# Patient Record
Sex: Female | Born: 1990 | Race: Black or African American | Hispanic: No | Marital: Single | Smoking: Current some day smoker
Health system: Southern US, Community
[De-identification: ages and names within clinical notes are randomized; demographics above are authoritative.]

## PROBLEM LIST (undated history)

## (undated) DIAGNOSIS — Z3490 Encounter for supervision of normal pregnancy, unspecified, unspecified trimester: Secondary | ICD-10-CM

---

## 2012-12-08 DIAGNOSIS — N912 Amenorrhea, unspecified: Secondary | ICD-10-CM | POA: Insufficient documentation

## 2012-12-08 DIAGNOSIS — Z3201 Encounter for pregnancy test, result positive: Secondary | ICD-10-CM | POA: Insufficient documentation

## 2013-06-25 ENCOUNTER — Emergency Department (HOSPITAL_BASED_OUTPATIENT_CLINIC_OR_DEPARTMENT_OTHER)
Admission: EM | Admit: 2013-06-25 | Discharge: 2013-06-25 | Disposition: A | Discharge status: Home / Self Care | Payer: Medicaid Other | Attending: Emergency Medicine | Admitting: Emergency Medicine

## 2013-06-25 ENCOUNTER — Encounter (HOSPITAL_BASED_OUTPATIENT_CLINIC_OR_DEPARTMENT_OTHER): Payer: Self-pay | Admitting: Emergency Medicine

## 2013-06-25 DIAGNOSIS — Z79899 Other long term (current) drug therapy: Secondary | ICD-10-CM | POA: Insufficient documentation

## 2013-06-25 DIAGNOSIS — A599 Trichomoniasis, unspecified: Secondary | ICD-10-CM

## 2013-06-25 DIAGNOSIS — O98819 Other maternal infectious and parasitic diseases complicating pregnancy, unspecified trimester: Secondary | ICD-10-CM | POA: Insufficient documentation

## 2013-06-25 DIAGNOSIS — A5901 Trichomonal vulvovaginitis: Secondary | ICD-10-CM | POA: Insufficient documentation

## 2013-06-25 LAB — URINALYSIS, ROUTINE W REFLEX MICROSCOPIC
BILIRUBIN URINE: NEGATIVE
Glucose, UA: NEGATIVE mg/dL
KETONES UR: NEGATIVE mg/dL
NITRITE: NEGATIVE
Protein, ur: NEGATIVE mg/dL
Specific Gravity, Urine: 1.023 (ref 1.005–1.030)
UROBILINOGEN UA: 1 mg/dL (ref 0.0–1.0)
pH: 6 (ref 5.0–8.0)

## 2013-06-25 LAB — WET PREP, GENITAL: YEAST WET PREP: NONE SEEN

## 2013-06-25 LAB — URINE MICROSCOPIC-ADD ON

## 2013-06-25 MED ORDER — ONDANSETRON HCL 8 MG PO TABS
4.0000 mg | ORAL_TABLET | Freq: Once | ORAL | Status: AC
  Administered 2013-06-25: 18:00:00 via ORAL
  Filled 2013-06-25: qty 1

## 2013-06-25 MED ORDER — METRONIDAZOLE 500 MG PO TABS
2000.0000 mg | ORAL_TABLET | Freq: Once | ORAL | Status: AC
  Administered 2013-06-25: 2000 mg via ORAL
  Filled 2013-06-25: qty 4

## 2013-06-25 NOTE — Discharge Instructions (Signed)
Trichomoniasis °Trichomoniasis is an infection, caused by the Trichomonas organism, that affects both women and men. In women, the outer female genitalia and the vagina are affected. In men, the penis is mainly affected, but the prostate and other reproductive organs can also be involved. Trichomoniasis is a sexually transmitted disease (STD) and is most often passed to another person through sexual contact. The majority of people who get trichomoniasis do so from a sexual encounter and are also at risk for other STDs. °CAUSES  °· Sexual intercourse with an infected partner. °· It can be present in swimming pools or hot tubs. °SYMPTOMS  °· Abnormal gray-green frothy vaginal discharge in women. °· Vaginal itching and irritation in women. °· Itching and irritation of the area outside the vagina in women. °· Penile discharge with or without pain in males. °· Inflammation of the urethra (urethritis), causing painful urination. °· Bleeding after sexual intercourse. °RELATED COMPLICATIONS °· Pelvic inflammatory disease. °· Infection of the uterus (endometritis). °· Infertility. °· Tubal (ectopic) pregnancy. °· It can be associated with other STDs, including gonorrhea and chlamydia, hepatitis B, and HIV. °COMPLICATIONS DURING PREGNANCY °· Early (premature) delivery. °· Premature rupture of the membranes (PROM). °· Low birth weight. °DIAGNOSIS  °· Visualization of Trichomonas under the microscope from the vagina discharge. °· Ph of the vagina greater than 4.5, tested with a test tape. °· Trich Rapid Test. °· Culture of the organism, but this is not usually needed. °· It may be found on a Pap test. °· Having a "strawberry cervix,"which means the cervix looks very red like a strawberry. °TREATMENT  °· You may be given medication to fight the infection. Inform your caregiver if you could be or are pregnant. Some medications used to treat the infection should not be taken during pregnancy. °· Over-the-counter medications or  creams to decrease itching or irritation may be recommended. °· Your sexual partner will need to be treated if infected. °HOME CARE INSTRUCTIONS  °· Take all medication prescribed by your caregiver. °· Take over-the-counter medication for itching or irritation as directed by your caregiver. °· Do not have sexual intercourse while you have the infection. °· Do not douche or wear tampons. °· Discuss your infection with your partner, as your partner may have acquired the infection from you. Or, your partner may have been the person who transmitted the infection to you. °· Have your sex partner examined and treated if necessary. °· Practice safe, informed, and protected sex. °· See your caregiver for other STD testing. °SEEK MEDICAL CARE IF:  °· You still have symptoms after you finish the medication. °· You have an oral temperature above 102° F (38.9° C). °· You develop belly (abdominal) pain. °· You have pain when you urinate. °· You have bleeding after sexual intercourse. °· You develop a rash. °· The medication makes you sick or makes you throw up (vomit). °Document Released: 09/25/2000 Document Revised: 06/24/2011 Document Reviewed: 10/21/2008 °[employer]® Patient Information ©2014 [employer]. ° °

## 2013-06-25 NOTE — ED Notes (Signed)
Pt. Reports she last saw OBGYN in Jan. 2015  Dr. Delford FieldWright with [facility].  Pt. Reports she is to see OBGYN again in April 2015

## 2013-06-25 NOTE — ED Notes (Signed)
Vaginal discharge thick and white. Lower abdominal pain. She is [redacted] weeks pregnant. Dysuria.

## 2013-06-25 NOTE — ED Provider Notes (Signed)
CSN: [id number]     Arrival date & time 06/25/13  1507 History   First MD Initiated Contact with Patient 06/25/13 1544     Chief Complaint  Patient presents with  . Abdominal Pain     (Consider location/radiation/quality/duration/timing/severity/associated sxs/prior Treatment) Patient is a 23 y.o. female presenting with abdominal pain.  Abdominal Pain  Pt is G6P1A4 at approx [redacted]wks gestation from previously done US reports she has had intermittent lower abdomen/pelvic pain throughout pregnancy but also began to have thick white vaginal discharge 2 days ago. She has not seen Ob yet due to weather related closures at the office but is scheduled for visit in about 3 weeks. She denies any vaginal bleeding, no fever. Mild diffuse back pain. No dysuria  History reviewed. No pertinent past medical history. History reviewed. No pertinent past surgical history. No family history on file. History  Substance Use Topics  . Smoking status: Never Smoker   . Smokeless tobacco: Not on file  . Alcohol Use: No   OB History   Grav Para Term Preterm Abortions TAB SAB Ect Mult Living   1              Review of Systems  Gastrointestinal: Positive for abdominal pain.   All other systems reviewed and are negative except as noted in HPI.     Allergies  Ibuprofen  Home Medications   Current Outpatient Rx  Name  Route  Sig  Dispense  Refill  . Prenatal Multivit-Min-Fe-FA (PRENATAL VITAMINS PO)   Oral   Take by mouth.          BP 109/71  Pulse 89  Temp(Src) 98.6 F (37 C) (Oral)  Resp 18  Ht 5\' 8"  (1.727 m)  Wt 212 lb (96.163 kg)  BMI 32.24 kg/m2  SpO2 99%  LMP 03/16/2013 Physical Exam  Nursing note and vitals reviewed. Constitutional: She is oriented to person, place, and time. She appears well-developed and well-nourished.  HENT:  Head: Normocephalic and atraumatic.  Eyes: EOM are normal. Pupils are equal, round, and reactive to light.  Neck: Normal range of motion. Neck  supple.  Cardiovascular: Normal rate, normal heart sounds and intact distal pulses.   Pulmonary/Chest: Effort normal and breath sounds normal.  Abdominal: Bowel sounds are normal. She exhibits no distension. There is no tenderness. There is no rebound and no guarding.  Genitourinary: Vaginal discharge found.  No bleeding, os closed, no adnexal masses, mild diffuse pelvic tenderness to palpation, uterus consistent with dates  Musculoskeletal: Normal range of motion. She exhibits no edema and no tenderness.  Neurological: She is alert and oriented to person, place, and time. She has normal strength. No cranial nerve deficit or sensory deficit.  Skin: Skin is warm and dry. No rash noted.  Psychiatric: She has a normal mood and affect.    ED Course  Procedures (including critical care time) Labs Review Labs Reviewed  WET PREP, GENITAL - Abnormal; Notable for the following:    Trich, Wet Prep FEW (*)    Clue Cells Wet Prep HPF POC MODERATE (*)    WBC, Wet Prep HPF POC MODERATE (*)    All other components within normal limits  URINALYSIS, ROUTINE W REFLEX MICROSCOPIC - Abnormal; Notable for the following:    APPearance CLOUDY (*)    Hgb urine dipstick TRACE (*)    Leukocytes, UA MODERATE (*)    All other components within normal limits  URINE MICROSCOPIC-ADD ON - Abnormal; Notable for the following:  Squamous Epithelial / LPF FEW (*)    Bacteria, UA FEW (*)    Crystals CA OXALATE CRYSTALS (*)    All other components within normal limits  GC/CHLAMYDIA PROBE AMP   Imaging Review No results found.   EKG Interpretation None      MDM   Final diagnoses:  Trichomonas infection   UA reported pos for Trichomonas, will confirm with vaginal swab.     Kandiss Ihrig B. Bernette Mayers, MD 06/25/13 1743

## 2013-06-26 LAB — GC/CHLAMYDIA PROBE AMP
CT Probe RNA: NEGATIVE
GC Probe RNA: NEGATIVE

## 2013-07-19 DIAGNOSIS — Z331 Pregnant state, incidental: Secondary | ICD-10-CM | POA: Insufficient documentation

## 2013-08-11 DIAGNOSIS — R87613 High grade squamous intraepithelial lesion on cytologic smear of cervix (HGSIL): Secondary | ICD-10-CM | POA: Insufficient documentation

## 2013-10-26 DIAGNOSIS — O98519 Other viral diseases complicating pregnancy, unspecified trimester: Secondary | ICD-10-CM

## 2013-10-26 DIAGNOSIS — B009 Herpesviral infection, unspecified: Secondary | ICD-10-CM | POA: Insufficient documentation

## 2013-10-26 DIAGNOSIS — O4700 False labor before 37 completed weeks of gestation, unspecified trimester: Secondary | ICD-10-CM | POA: Insufficient documentation

## 2013-12-01 DIAGNOSIS — O99820 Streptococcus B carrier state complicating pregnancy: Secondary | ICD-10-CM | POA: Insufficient documentation

## 2013-12-17 DIAGNOSIS — N390 Urinary tract infection, site not specified: Secondary | ICD-10-CM | POA: Insufficient documentation

## 2014-02-14 ENCOUNTER — Encounter (HOSPITAL_BASED_OUTPATIENT_CLINIC_OR_DEPARTMENT_OTHER): Payer: Self-pay | Admitting: Emergency Medicine

## 2014-05-31 ENCOUNTER — Emergency Department (HOSPITAL_BASED_OUTPATIENT_CLINIC_OR_DEPARTMENT_OTHER): Payer: 59

## 2014-05-31 ENCOUNTER — Encounter (HOSPITAL_BASED_OUTPATIENT_CLINIC_OR_DEPARTMENT_OTHER): Payer: Self-pay | Admitting: *Deleted

## 2014-05-31 ENCOUNTER — Emergency Department (HOSPITAL_BASED_OUTPATIENT_CLINIC_OR_DEPARTMENT_OTHER)
Admission: EM | Admit: 2014-05-31 | Discharge: 2014-05-31 | Disposition: A | Discharge status: Home / Self Care | Payer: 59 | Attending: Emergency Medicine | Admitting: Emergency Medicine

## 2014-05-31 DIAGNOSIS — H938X3 Other specified disorders of ear, bilateral: Secondary | ICD-10-CM | POA: Insufficient documentation

## 2014-05-31 DIAGNOSIS — R05 Cough: Secondary | ICD-10-CM | POA: Diagnosis present

## 2014-05-31 DIAGNOSIS — J069 Acute upper respiratory infection, unspecified: Secondary | ICD-10-CM | POA: Insufficient documentation

## 2014-05-31 MED ORDER — SALINE SPRAY 0.65 % NA SOLN: 1.0000 | NASAL | Status: DC | PRN

## 2014-05-31 NOTE — ED Notes (Signed)
Cough with blood tinged sputum and stuffy nose with green returns.

## 2014-05-31 NOTE — ED Provider Notes (Signed)
CSN: [id number]     Arrival date & time 05/31/14  1121 History   First MD Initiated Contact with Patient 05/31/14 1341     Chief Complaint  Patient presents with  . Cough     (Consider location/radiation/quality/duration/timing/severity/associated sxs/prior Treatment) HPI Molly Sawyer is a 24 year old female with no past medical history who presents the ER complaining of nasal congestion, cough. Patient states her symptoms began gradually approximately 3 days ago, and have since persisted. Patient reports associated productive cough with green sputum and mild streaks of blood, ear fullness bilaterally, facial pain, mild body aches, mild sore throat. Patient denies shortness of breath, chest pain, dizziness, nausea, vomiting, fever.  History reviewed. No pertinent past medical history. Past Surgical History  Procedure Laterality Date  . Cesarean section     No family history on file. History  Substance Use Topics  . Smoking status: Never Smoker   . Smokeless tobacco: Not on file  . Alcohol Use: No   OB History    Gravida Para Term Preterm AB TAB SAB Ectopic Multiple Living   1              Review of Systems  Constitutional: Negative for fever.  HENT: Positive for congestion, sinus pressure and sore throat. Negative for hearing loss, trouble swallowing and voice change.   Eyes: Negative for visual disturbance.  Respiratory: Positive for cough. Negative for chest tightness and shortness of breath.   Cardiovascular: Negative for chest pain.  Gastrointestinal: Negative for nausea, vomiting and abdominal pain.  Genitourinary: Negative for dysuria.  Musculoskeletal: Positive for myalgias.  Skin: Negative for rash.  Neurological: Negative for dizziness, syncope, weakness, numbness and headaches.  Psychiatric/Behavioral: Negative.       Allergies  Ibuprofen  Home Medications   Prior to Admission medications   Medication Sig Start Date End Date Taking? Authorizing Provider   Prenatal Multivit-Min-Fe-FA (PRENATAL VITAMINS PO) Take by mouth.    Historical Provider, MD  sodium chloride (OCEAN) 0.65 % SOLN nasal spray Place 1 spray into both nostrils as needed for congestion. 05/31/14   Monte FantasiaJoseph W Adaley Kiene, PA-C   BP 122/77 mmHg  Pulse 68  Temp(Src) 98 F (36.7 C) (Oral)  Resp 16  Ht 5\' 8"  (1.727 m)  Wt 216 lb (97.977 kg)  BMI 32.85 kg/m2  SpO2 98%  LMP 05/08/2013  Breastfeeding? Unknown Physical Exam  Constitutional: She is oriented to person, place, and time. She appears well-developed and well-nourished. No distress.  HENT:  Head: Normocephalic and atraumatic.  Right Ear: Tympanic membrane normal.  Left Ear: Tympanic membrane normal.  Nose: Right sinus exhibits maxillary sinus tenderness and frontal sinus tenderness. Left sinus exhibits maxillary sinus tenderness and frontal sinus tenderness.  Mouth/Throat: Uvula is midline and mucous membranes are normal. No trismus in the jaw. No dental abscesses or uvula swelling. Posterior oropharyngeal erythema present. No oropharyngeal exudate, posterior oropharyngeal edema or tonsillar abscesses.  TMs well-appearing with mild serous effusion. Nasal turbinates mildly enlarged bilaterally with mild erythema. Maxillary and frontal sinus tenderness noted bilaterally. Mild posterior oropharyngeal erythema without edema. No tonsillar exudate or swelling. Uvula midline no trismus.  Eyes: Right eye exhibits no discharge. Left eye exhibits no discharge. No scleral icterus.  Neck: Normal range of motion.  Pulmonary/Chest: Effort normal and breath sounds normal. No accessory muscle usage. No tachypnea. No respiratory distress.  Musculoskeletal: Normal range of motion.  Neurological: She is alert and oriented to person, place, and time. She has normal strength. No cranial nerve deficit  or sensory deficit. Gait normal. GCS eye subscore is 4. GCS verbal subscore is 5. GCS motor subscore is 6.  Skin: Skin is warm and dry. She is not  diaphoretic.  Psychiatric: She has a normal mood and affect.  Nursing note and vitals reviewed.   ED Course  Procedures (including critical care time) Labs Review Labs Reviewed - No data to display  Imaging Review Dg Chest 2 View  05/31/2014   CLINICAL DATA:  24 year old female with 1 week history of cough and congestion. Blood tinged sputum.  EXAM: CHEST  2 VIEW  COMPARISON:  No priors.  FINDINGS: Lung volumes are normal. No consolidative airspace disease. No pleural effusions. No pneumothorax. No pulmonary nodule or mass noted. Pulmonary vasculature and the cardiomediastinal silhouette are within normal limits.  IMPRESSION: No radiographic evidence of acute cardiopulmonary disease.   Electronically Signed   By: Trudie Reed M.D.   On: 05/31/2014 12:05     EKG Interpretation None      MDM   Final diagnoses:  URI (upper respiratory infection)    Pt CXR negative for acute infiltrate. Patients symptoms are consistent with URI, likely viral etiology. No concern for PTA or retropharyngeal abscess. No concern for pneumonia. Discussed that antibiotics are not indicated for viral infections. Pt will be discharged with symptomatic treatment.  Verbalizes understanding and is agreeable with plan. Pt is hemodynamically stable & in NAD prior to dc. I discussed return precautions with patient, patient verbalizes understanding and agreement of this plan. I encouraged patient to follow up with a primary care doctor and provided her with a resource guide to help her find one. I encouraged patient to call or return to the ER if any worsening of symptoms or should she have any questions or concerns.  BP 122/77 mmHg  Pulse 68  Temp(Src) 98 F (36.7 C) (Oral)  Resp 16  Ht  (1.727 m)  Wt 216 lb (97.977 kg)  BMI 32.85 kg/m2  SpO2 98%  LMP 05/08/2013  Breastfeeding? Unknown  Signed,  Ladona Mow, PA-C 2:24 PM      Monte Fantasia, PA-C 05/31/14 1424  Derwood Kaplan, MD 05/31/14  1540

## 2014-05-31 NOTE — Discharge Instructions (Signed)
Upper Respiratory Infection, Adult °An upper respiratory infection (URI) is also sometimes known as the common cold. The upper respiratory tract includes the nose, sinuses, throat, trachea, and bronchi. Bronchi are the airways leading to the lungs. Most people improve within 1 week, but symptoms can last up to 2 weeks. A residual cough may last even longer.  °CAUSES °Many different viruses can infect the tissues lining the upper respiratory tract. The tissues become irritated and inflamed and often become very moist. Mucus production is also common. A cold is contagious. You can easily spread the virus to others by oral contact. This includes kissing, sharing a glass, coughing, or sneezing. Touching your mouth or nose and then touching a surface, which is then touched by another person, can also spread the virus. °SYMPTOMS  °Symptoms typically develop 1 to 3 days after you come in contact with a cold virus. Symptoms vary from person to person. They may include: °· Runny nose. °· Sneezing. °· Nasal congestion. °· Sinus irritation. °· Sore throat. °· Loss of voice (laryngitis). °· Cough. °· Fatigue. °· Muscle aches. °· Loss of appetite. °· Headache. °· Low-grade fever. °DIAGNOSIS  °You might diagnose your own cold based on familiar symptoms, since most people get a cold 2 to 3 times a year. Your caregiver can confirm this based on your exam. Most importantly, your caregiver can check that your symptoms are not due to another disease such as strep throat, sinusitis, pneumonia, asthma, or epiglottitis. Blood tests, throat tests, and X-rays are not necessary to diagnose a common cold, but they may sometimes be helpful in excluding other more serious diseases. Your caregiver will decide if any further tests are required. °RISKS AND COMPLICATIONS  °You may be at risk for a more severe case of the common cold if you smoke cigarettes, have chronic heart disease (such as heart failure) or lung disease (such as asthma), or if  you have a weakened immune system. The very young and very old are also at risk for more serious infections. Bacterial sinusitis, middle ear infections, and bacterial pneumonia can complicate the common cold. The common cold can worsen asthma and chronic obstructive pulmonary disease (COPD). Sometimes, these complications can require emergency medical care and may be life-threatening. °PREVENTION  °The best way to protect against getting a cold is to practice good hygiene. Avoid oral or hand contact with people with cold symptoms. Wash your hands often if contact occurs. There is no clear evidence that vitamin C, vitamin E, echinacea, or exercise reduces the chance of developing a cold. However, it is always recommended to get plenty of rest and practice good nutrition. °TREATMENT  °Treatment is directed at relieving symptoms. There is no cure. Antibiotics are not effective, because the infection is caused by a virus, not by bacteria. Treatment may include: °· Increased fluid intake. Sports drinks offer valuable electrolytes, sugars, and fluids. °· Breathing heated mist or steam (vaporizer or shower). °· Eating chicken soup or other clear broths, and maintaining good nutrition. °· Getting plenty of rest. °· Using gargles or lozenges for comfort. °· Controlling fevers with ibuprofen or acetaminophen as directed by your caregiver. °· Increasing usage of your inhaler if you have asthma. °Zinc gel and zinc lozenges, taken in the first 24 hours of the common cold, can shorten the duration and lessen the severity of symptoms. Pain medicines may help with fever, muscle aches, and throat pain. A variety of non-prescription medicines are available to treat congestion and runny nose. Your caregiver   can make recommendations and may suggest nasal or lung inhalers for other symptoms.  °HOME CARE INSTRUCTIONS  °· Only take over-the-counter or prescription medicines for pain, discomfort, or fever as directed by your  caregiver. °· Use a warm mist humidifier or inhale steam from a shower to increase air moisture. This may keep secretions moist and make it easier to breathe. °· Drink enough water and fluids to keep your urine clear or pale yellow. °· Rest as needed. °· Return to work when your temperature has returned to normal or as your caregiver advises. You may need to stay home longer to avoid infecting others. You can also use a face mask and careful hand washing to prevent spread of the virus. °SEEK MEDICAL CARE IF:  °· After the first few days, you feel you are getting worse rather than better. °· You need your caregiver's advice about medicines to control symptoms. °· You develop chills, worsening shortness of breath, or brown or red sputum. These may be signs of pneumonia. °· You develop yellow or brown nasal discharge or pain in the face, especially when you bend forward. These may be signs of sinusitis. °· You develop a fever, swollen neck glands, pain with swallowing, or white areas in the back of your throat. These may be signs of strep throat. °SEEK IMMEDIATE MEDICAL CARE IF:  °· You have a fever. °· You develop severe or persistent headache, ear pain, sinus pain, or chest pain. °· You develop wheezing, a prolonged cough, cough up blood, or have a change in your usual mucus (if you have chronic lung disease). °· You develop sore muscles or a stiff neck. °Document Released: 09/25/2000 Document Revised: 06/24/2011 Document Reviewed: 07/07/2013 °ExitCare® Patient Information ©2015 [employer]. This information is not intended to replace advice given to you by your health care provider. Make sure you discuss any questions you have with your health care provider. ° °Emergency Department Resource Guide °1) Find a Doctor and Pay Out of Pocket °Although you won't have to find out who is covered by your insurance plan, it is a good idea to ask around and get recommendations. You will then need to call the office and see if  the doctor you have chosen will accept you as a new patient and what types of options they offer for patients who are self-pay. Some doctors offer discounts or will set up payment plans for their patients who do not have insurance, but you will need to ask so you aren't surprised when you get to your appointment. ° °2) Contact Your Local Health Department °Not all health departments have doctors that can see patients for sick visits, but many do, so it is worth a call to see if yours does. If you don't know where your local health department is, you can check in your phone book. The CDC also has a tool to help you locate your state's health department, and many state websites also have listings of all of their local health departments. ° °3) Find a Walk-in Clinic °If your illness is not likely to be very severe or complicated, you may want to try a walk in clinic. These are popping up all over the country in pharmacies, drugstores, and shopping centers. They're usually staffed by nurse practitioners or physician assistants that have been trained to treat common illnesses and complaints. They're usually fairly quick and inexpensive. However, if you have serious medical issues or chronic medical problems, these are probably not your best option. ° °  No Primary Care Doctor: °- Call [facility] at  832-8000 - they can help you locate a primary care doctor that  accepts your insurance, provides certain services, etc. °- Physician Referral Service- 1-800-533-3463 ° °Chronic Pain Problems: °Organization         Address  Phone   Notes  °[facility]  (336) 297-2271 Patients need to be referred by their primary care doctor.  ° °Medication Assistance: °Organization         Address  Phone   Notes  °[place] [facility] [address] °[place], [place] [address] (336) 641-8030 --Must be a resident of [place] °-- Must have NO insurance coverage whatsoever (no  Medicaid/ Medicare, etc.) °-- The pt. MUST have a primary care doctor that directs their care regularly and follows them in the community °  °[facility]  (866) 331-1348   °[employer]  (888) 892-1162   ° °Agencies that provide inexpensive medical care: °Organization         Address  Phone   Notes  °[facility]  (336) 832-8035   °[facility]    (336) 832-7272   °[facility] [address] °[place], [place] [address] (336) 832-4777   °[facility] [address], °[place] (336) 271-4999   °[facility]    (336) 373-0678   °[facility]    (336) 272-1050   °[facility] ° [address], [place] Phone:  (336) 832-4444, Fax:  (336) 832-4440 Hours of Operation:  9 am - 6 pm, M-F.  Also accepts Medicaid/Medicare and self-pay.  °[facility] ° [address], [place] Phone: (336) [phone], Fax: (336) [phone]. Hours of Operation:  8:30 am - 5:30 pm, M-F.  Also accepts Medicaid and self-pay.  °[facility] [place] [address], [place] Phone: (336) 878-6027   °[facility] [address], [place], [place] (336)723-1848, Ext. 123 Mondays & Thursdays: 7-9 AM.  First 15 patients are seen on a first come, first serve basis. °  ° °Medicaid-accepting [place] Providers: ° °Organization         Address  Phone   Notes  °[facility] [address], [place] (336) 641-2100 Also accepts self-pay patients.  °[facility] [address], [place] ° (336) 856-9996   °[facility] [address], [place] (336) 288-8857   °[facility] [address] [place] [address], [place] (336) 299-7000   °Veita Bland [address], [place]  ° (336) 373-1557 Only accepts [place] Access Medicaid patients after they have their name applied to their card.   ° °Self-Pay (no insurance) in [place]: ° °Organization         Address  Phone   Notes  °Sickle Cell Patients, [facility] [address], [place] (336) 832-1970   °[facility] [address], [place] (336) 832-4400   °[facility] ° [address], [place] (336) 992-4800   °[facility]/Dr. Osei-Bonsu ° [address] [place] [address], [place] or [address], [place] (336) 841-8500 Phone number for both [place] and [place] locations is the same.  °[facility] [address], [place] (336) 299-0000   °  [facility] [address] [place] [address], [place] or [address] (336) 852-7530 °(336) 878-2260   °[facility] [address], [place] (336) 350-1642, phone; (336) 294-5005, fax Sees patients 1st and 3rd Saturday of every month.  Must not qualify for public or private insurance (i.e. Medicaid, Medicare, [place] Health Choice, Veterans' Benefits) • Household income should be no more than 200% of the poverty level •The clinic cannot treat you if you are pregnant or think you are pregnant • Sexually transmitted diseases are not treated at the clinic.  ° ° °Dental Care: °Organization         Address  Phone  Notes  °[facility] [address], [place] (336) 641-6152 Accepts children up to age 21 who are enrolled in Medicaid or [place] Health Choice; pregnant women with a Medicaid card; and children who have applied for Medicaid or [place] Health Choice, but were declined, whose parents can pay a reduced fee at time of service.  °[facility] [place]  [address], [place] (336) 641-7733 Accepts children up to age 21 who are enrolled in Medicaid or [place] Health Choice; pregnant women with a Medicaid card; and children who have applied for Medicaid or [place] Health Choice,  but were declined, whose parents can pay a reduced fee at time of service.  °[facility] ° [address], [place] (336) 641-4533 Patients are seen by appointment only. Walk-ins are not accepted. [facility] will see patients 18 years of age and older. °Monday - Tuesday (8am-5pm) °Most Wednesdays (8:30-5pm) °$30 per visit, cash only  °[facility] ° [address], [place] (336) 641-4533 Patients are seen by appointment only. Walk-ins are not accepted. [facility] will see patients 18 years of age and older. °One Wednesday Evening (Monthly: Volunteer Based).  $30 per visit, cash only  °[facility]  (919) 537-3737 for adults; Children under age 4, call Graduate Pediatric Dentistry at ([phone]) [phone]. Children aged 4-14, please call ([phone]) [phone] to request a pediatric application. ° Dental services are provided in all areas of dental care including fillings, crowns and bridges, complete and partial dentures, implants, gum treatment, root canals, and extractions. Preventive care is also provided. Treatment is provided to both adults and children. °Patients are selected via a lottery and there is often a waiting list. °  °[facility] [address], °[place] ° (336) 763-8833 [email or web address] °  °[facility] [address], [place], [place] (336)723-1848, Ext. 123 Second and Fourth Thursday of each month, opens at 6:30 AM; Clinic ends at 9 AM.  Patients are seen on a first-come first-served basis, and a limited number are seen during each clinic.  ° °[facility] ° [address], [place], [place] (336) 723-7904   Eligibility Requirements °You must have lived in [place], [place], or [place] counties for at least the last three months. °  You cannot be eligible for state or federal sponsored healthcare insurance, including Veterans Administration, Medicaid, or Medicare. °  You generally  cannot be eligible for healthcare insurance through your employer.  °  How to apply: °Eligibility screenings are held every Tuesday and Wednesday afternoon from 1:00 pm until 4:00 pm. You do not need an appointment for the interview!  °[facility] [address], [place],  336-631-2330   °  [facility] [facility]  336-342-8273   °[facility]  336-703-3100   °[facility]  336-570-6415   ° °Behavioral Health Resources in the Community: °Intensive Outpatient Programs °Organization         Address  Phone  Notes  °[facility] [address], [place], [place] 336-878-6098   °[facility] [address], [place], [place] 336-832-9800   °[facility] [address], [place], [place] ° 336-882-2125   °[facility] [address],  °[place], [place] 1-800-853-5163 or 336-641-4981   °Substance Abuse Resources °Organization         Address  Phone  Notes  °[facility]  336-882-2125   °[facility]  336-784-9470   °[facility]  336-285-9073   °[facility]  336-845-3988   °Residential & Outpatient Substance Abuse Program  1-800-659-3381   °Psychological Services °Organization         Address  Phone  Notes  °[facility]  336- 832-9600   °[facility]  336- 378-7881   °[facility] [address], [place] 1-800-853-5163 or 336-641-4981   ° °Mobile Crisis Teams °Organization         Address  Phone  Notes  °[facility], Mobile Crisis Care Unit  1-877-626-1772   °Assertive °[facility] ° [address] [place], [place] 336-834-9664   °Sharon DeEsch [address] °[place] 336-554-5454   ° °Self-Help/Support Groups °Organization         Address  Phone             Notes  °[facility] - variety of support groups  336- 373-1402 Call for more  information  °Narcotics Anonymous (NA), [facility] [address], °[place]  2 meetings at this location  ° °Residential Treatment Programs °Organization         Address  Phone  Notes  °[facility] [address],    °[place]  1-866-801-8205   °[facility] ° [address], [place], [place] 704-293-8524   °[facility] [address], [place] 336-845-3988 Admissions: 8am-3pm M-F  °[facility] [address]    °[place], [place] 336-841-1104   °[facility] [address], [place], [place] 336-379-7146   °[facility] [address]  °[place], [place] 336-285-9073   °[facility] - Intensive Outpatient [address], [place], [place] 336-852-3033   °[facility] [address]  °[place], [place] 1-877-615-2722 or 336-784-9470   °[facility] [address] [place], [place] 336-227-7417 Accepts Medicaid  °[facility] [address]  ° [place] 1-800-659-3381 Substance Abuse/Addiction Treatment  ° °[place] Behavioral Health Resources °Organization         Address  Phone  Notes  °[facility]  (888) 581-9988   °Julie Brannon, PhD [address] [place], [place]   (336) 349-5553 or (336) 951-0000   °[facility]   [address] °[place], [place] (336) 349-4454   °[facility] [address], [place], [place] (336) 342-8316 Insurance/Medicaid/sponsorship through [facility]  °[facility] [address]                                      [place], [place] (336) 342-8316 Therapy/tele-psych/case  °[facility] [address]  ° [place], [place] (336) 349-2233    °Dr. Arfeen  (336) 349-4544   °[facility] [place]  [employer] [place] [facility] 1) [address], [place] °2) [address], [place] °3)  [address], [place] (336)  349-3220 °(336) 342-7768 ° °(336) 342-8140   °[place] [facility] (336) 342-1394 or (336) 342-3537 (After Hours)    ° ° °

## 2015-02-07 ENCOUNTER — Encounter (HOSPITAL_BASED_OUTPATIENT_CLINIC_OR_DEPARTMENT_OTHER): Payer: Self-pay | Admitting: *Deleted

## 2015-02-07 ENCOUNTER — Emergency Department (HOSPITAL_BASED_OUTPATIENT_CLINIC_OR_DEPARTMENT_OTHER)
Admission: EM | Admit: 2015-02-07 | Discharge: 2015-02-07 | Disposition: A | Discharge status: Home / Self Care | Payer: 59 | Attending: Emergency Medicine | Admitting: Emergency Medicine

## 2015-02-07 DIAGNOSIS — J020 Streptococcal pharyngitis: Secondary | ICD-10-CM

## 2015-02-07 DIAGNOSIS — J209 Acute bronchitis, unspecified: Secondary | ICD-10-CM | POA: Insufficient documentation

## 2015-02-07 DIAGNOSIS — R110 Nausea: Secondary | ICD-10-CM | POA: Insufficient documentation

## 2015-02-07 DIAGNOSIS — R509 Fever, unspecified: Secondary | ICD-10-CM | POA: Diagnosis present

## 2015-02-07 LAB — RAPID STREP SCREEN (MED CTR MEBANE ONLY): STREPTOCOCCUS, GROUP A SCREEN (DIRECT): POSITIVE — AB

## 2015-02-07 MED ORDER — DEXAMETHASONE 4 MG PO TABS
12.0000 mg | ORAL_TABLET | Freq: Once | ORAL | Status: AC
  Administered 2015-02-07: 12 mg via ORAL
  Filled 2015-02-07: qty 3

## 2015-02-07 MED ORDER — PENICILLIN G BENZATHINE & PROC 1200000 UNIT/2ML IM SUSP
1.2000 10*6.[IU] | Freq: Once | INTRAMUSCULAR | Status: AC
  Administered 2015-02-07: 1.2 10*6.[IU] via INTRAMUSCULAR
  Filled 2015-02-07: qty 2

## 2015-02-07 MED ORDER — ACETAMINOPHEN 500 MG PO TABS
ORAL_TABLET | ORAL | Status: AC
  Administered 2015-02-07: 1000 mg via ORAL
  Filled 2015-02-07: qty 2

## 2015-02-07 MED ORDER — ACETAMINOPHEN 500 MG PO TABS
1000.0000 mg | ORAL_TABLET | Freq: Once | ORAL | Status: AC
  Administered 2015-02-07: 1000 mg via ORAL

## 2015-02-07 NOTE — Discharge Instructions (Signed)
Strep Throat °Strep throat is a bacterial infection of the throat. Your health care provider may call the infection tonsillitis or pharyngitis, depending on whether there is swelling in the tonsils or at the back of the throat. Strep throat is most common during the cold months of the year in children who are 5-26 years of age, but it can happen during any season in people of any age. This infection is spread from person to person (contagious) through coughing, sneezing, or close contact. °CAUSES °Strep throat is caused by the bacteria called Streptococcus pyogenes. °RISK FACTORS °This condition is more likely to develop in: °· People who spend time in crowded places where the infection can spread easily. °· People who have close contact with someone who has strep throat. °SYMPTOMS °Symptoms of this condition include: °· Fever or chills.   °· Redness, swelling, or pain in the tonsils or throat. °· Pain or difficulty when swallowing. °· White or yellow spots on the tonsils or throat. °· Swollen, tender glands in the neck or under the jaw. °· Red rash all over the body (rare). °DIAGNOSIS °This condition is diagnosed by performing a rapid strep test or by taking a swab of your throat (throat culture test). Results from a rapid strep test are usually ready in a few minutes, but throat culture test results are available after one or two days. °TREATMENT °This condition is treated with antibiotic medicine. °HOME CARE INSTRUCTIONS °Medicines °· Take over-the-counter and prescription medicines only as told by your health care provider. °· Take your antibiotic as told by your health care provider. Do not stop taking the antibiotic even if you start to feel better. °· Have family members who also have a sore throat or fever tested for strep throat. They may need antibiotics if they have the strep infection. °Eating and Drinking °· Do not share food, drinking cups, or personal items that could cause the infection to spread to  other people. °· If swallowing is difficult, try eating soft foods until your sore throat feels better. °· Drink enough fluid to keep your urine clear or pale yellow. °General Instructions °· Gargle with a salt-water mixture 3-4 times per day or as needed. To make a salt-water mixture, completely dissolve ½-1 tsp of salt in 1 cup of warm water. °· Make sure that all household members wash their hands well. °· Get plenty of rest. °· Stay home from school or work until you have been taking antibiotics for 24 hours. °· Keep all follow-up visits as told by your health care provider. This is important. °SEEK MEDICAL CARE IF: °· The glands in your neck continue to get bigger. °· You develop a rash, cough, or earache. °· You cough up a thick liquid that is green, yellow-brown, or bloody. °· You have pain or discomfort that does not get better with medicine. °· Your problems seem to be getting worse rather than better. °· You have a fever. °SEEK IMMEDIATE MEDICAL CARE IF: °· You have new symptoms, such as vomiting, severe headache, stiff or painful neck, chest pain, or shortness of breath. °· You have severe throat pain, drooling, or changes in your voice. °· You have swelling of the neck, or the skin on the neck becomes red and tender. °· You have signs of dehydration, such as fatigue, dry mouth, and decreased urination. °· You become increasingly sleepy, or you cannot wake up completely. °· Your joints become red or painful. °  °This information is not intended to replace   advice given to you by your health care provider. Make sure you discuss any questions you have with your health care provider.   Document Released: 03/29/2000 Document Revised: 12/21/2014 Document Reviewed: 07/25/2014 Elsevier Interactive Patient Education 2016 [employer]   Emergency Department Resource Guide 1) Find a Doctor and Pay Out of Pocket Although you won't have to find out who is covered by your insurance plan, it is a good idea to  ask around and get recommendations. You will then need to call the office and see if the doctor you have chosen will accept you as a new patient and what types of options they offer for patients who are self-pay. Some doctors offer discounts or will set up payment plans for their patients who do not have insurance, but you will need to ask so you aren't surprised when you get to your appointment.  2) Contact Your Local Health Department Not all health departments have doctors that can see patients for sick visits, but many do, so it is worth a call to see if yours does. If you don't know where your local health department is, you can check in your phone book. The CDC also has a tool to help you locate your state's health department, and many state websites also have listings of all of their local health departments.  3) Find a Walk-in Clinic If your illness is not likely to be very severe or complicated, you may want to try a walk in clinic. These are popping up all over the country in pharmacies, drugstores, and shopping centers. They're usually staffed by nurse practitioners or physician assistants that have been trained to treat common illnesses and complaints. They're usually fairly quick and inexpensive. However, if you have serious medical issues or chronic medical problems, these are probably not your best option.  No Primary Care Doctor: - Call Health Connect at  [phone] - they can help you locate a primary care doctor that  accepts your insurance, provides certain services, etc. - Physician Referral Service- [phone]  Chronic Pain Problems: Organization         Address  Phone   Notes  [facility]  [phone] Patients need to be referred by their primary care doctor.   Medication Assistance: Organization         Address  Phone   Notes  [facility] [address] [place], [place] [address] [phone] --Must be a  resident of [place] [facility] [place] -- Must have NO insurance coverage whatsoever (no Medicaid/ Medicare, etc.) -- The pt. MUST have a primary care doctor that directs their care regularly and follows them in the community   MedAssist  [phone]   [employer]  [phone]) [phone]    Agencies that provide inexpensive medical care: Organization         Address  Phone   Notes  [facility]  [phone]   [facility]    [phone]   [facility] [address] [place], [place] [address] [phone]   [facility] [address], [place] [phone]) [phone]   [facility]anned Parenthood    [phone]   [facility]    [phone]   [facility]  [address], [place] Phone:  [phone], Fax:  [phone]) [phone] Hours of Operation:  9 am - 6 pm, M-F.  Also accepts Medicaid/Medicare  and self-pay.  [facility]  [address], [place] Phone: [phone], Fax: [phone]. Hours of Operation:  8:30 am - 5:30 pm, M-F.  Also accepts Medicaid and self-pay.  [facility] [place] [address] [place] Phone: [phone]   [facility] [address] Natasha Bence [place], [place] [phone], Ext. [phone] Mondays & Thursdays: 7-9 AM.  First 15 patients are seen on a first come, first serve basis.    Medicaid-accepting [facility] Providers:  Organization         Address  Phone   Notes  [facility] [address], [place] [phone] Also accepts self-pay patients.  [facility] [address] Laurell Josephs [place], [place]  [phone]   [facility] [address], [place] [phone]   [facility] [address], [place] [phone]   Renaye Rakers [address], [place]   [phone] Only accepts  [place] Access [place] patients after they have their name applied to their card.   Self-Pay (no insurance) in [facility]:  Organization         Address  Phone   Notes  Sickle Cell Patients, [facility] [address] [place], [place] [phone]   [facility] [address] [place], [place] [phone]   [facility]  [address], [place] [phone]   [facility]/Dr. Osei-Bonsu  [address] [place] or [address], [place] [phone] Phone number for both [place] and [place] locations is the same.  [facility] [address], [place] [phone]   [facility] [address], [place] or [address] [phone]   [facility] [address] [place] [phone], phone; [phone], fax Sees patients 1st and 3rd Saturday of every month.  Must not qualify for public or private insurance (i.e. Medicaid, Medicare, [place] Health Choice, Veterans' Benefits)  Household income should be no more than 200% of the poverty level The clinic cannot treat you if you are pregnant or think you are pregnant  Sexually transmitted diseases are not treated at the clinic.    Dental Care: Organization         Address  Phone  Notes  [facility] [address] [place], [place] [phone] Accepts children up to age 54 who are enrolled in [place] or [place] Health Choice; pregnant women with a Medicaid card; and children who have applied for Medicaid or [place] Health Choice, but were declined, whose parents can pay a reduced fee at time of service.  [facility] [place]  [address], [place] [phone] Accepts children up to age 41 who are enrolled in [place] or [place] Health Choice; pregnant women  with a Medicaid card; and children who have applied for Medicaid or  Health Choice, but were declined, whose parents can pay a reduced fee at time of service.  [facility]  [address] [place], [place] [phone] Patients are seen by appointment only. Walk-ins are not accepted. [facility] will see patients 56 years of age and older. Monday - Tuesday (8am-5pm) Most Wednesdays (  8:30-5pm) $30 per visit, cash only  [facility] Adult Dental Access PRO[facility]  [address], [facility] [phone] Patients are seen by appointment only. Walk-ins are not accepted. [facility] will see patients 71 years of age and older. One Wednesday Evening (Monthly: Volunteer Based).  $30 per visit, cash only  [employer]  [phone] for adults; Children under age 78, call Graduate Pediatric Dentistry at [phone]. Children aged 36-14, please call [phone] to request a pediatric application.  Dental services are provided in all areas of dental care including fillings, crowns and bridges, complete and partial dentures, implants, gum treatment, root canals, and extractions. Preventive care is also provided. Treatment is provided to both adults and children. Patients are selected via a lottery and there is often a waiting list.   [facility] [address] [place]  [phone] [email or web address]   [facility] [address] [place], [place] [phone], Ext. [phone] Second and Fourth Thursday of each month, opens at 6:30 AM; Clinic ends at 9 AM.  Patients are seen on a first-come first-served basis, and a limited number are seen during each clinic.   [facility]  [address] Ether [place], [place] [phone]   Eligibility Requirements You must have lived in [place], [place], or [place] counties for at least the last three months.   You cannot be eligible for state or federal sponsored [employer], including [employer], [place], or [employer].   You generally cannot be eligible for healthcare insurance through your employer.    How to apply: Eligibility screenings are held every Tuesday and Wednesday afternoon from 1:00 pm until 4:00 pm. You do not need an appointment for the interview!  [facility] [address], [place], [place] [phone]   [facility] Department  [phone]   [facility] Health Department  [phone]   [facility] Department  [phone]    Behavioral Health Resources in the Community: Intensive Outpatient Programs Organization         Address  Phone  Notes  [facility] [address] [place], [place] [phone]   [facility] Outpatient [address] [place], [place] [phone]   [facility] [address], [place], [place]  [phone]   [facility] [address],  [place], [place] [phone] or [phone]   Substance Abuse Resources Organization         Address  Phone  Notes  [facility] Drug Services  [phone]   [facility]  [phone]   [facility] [place]  [phone]   Floydene Flock  [phone]   Residential & Outpatient Substance Abuse Program  [phone]   Psychological Services Organization         Address  Phone  Notes  [facility]  [phone]   [facility] Services  [phone]   [facility] Health [address], [place] [phone] or [phone]    Mobile Crisis Teams Organization         Address  Phone  Notes  [facility], Mobile Crisis Care Unit  [phone]   [facility]  [address] [place], [place] [phone]   Doristine Locks [address] [place] [phone]    Self-Help/Support Groups Organization         Address  Phone  Notes  [facility]  [facility] [place] - variety of support groups  [place] Call for more information  Narcotics Anonymous (NA), [facility] [address], [employer] [place]  2 meetings at this location   Special educational needs teacher         Address  Phone  Notes  [facility] [place],    [place]  [phone]   [facility]  [address] [place] [address]2725, [place], [place]   [place], [place] [phone] Admissions: 8am-3pm M-F  Incentives Substance [place] [address],    [place], [place] [phone]   [facility] [address] [place], [place], [place]   [facility] [address]  [place], [place]   [facility] - Intensive Outpatient [address] Kristeen [address], [place], [place]   [facility] [place]  [place], [place] [phone] or [phone]   [facility] ([facility]) [address], [place], [place] Accepts Medicaid  [facility] [place] [address],  [place] [phone] Substance Abuse/Addiction Treatment   [facility] Organization         Address  Phone  Notes  [facility]  [phone]   Domenic Schwab, PhD [address] Arlis Porta [place], [place]   [phone] or [phone]   [place], [place] [phone]   [facility] [address], [place], [place] [phone] Insurance/Medicaid/sponsorship through [facility] and Families [address] [place]                                    [place], [place] [phone] [place] [address], [place] [phone]    Dr. Adele Schilder  [phone]   [facility] Dept. 1) [address],   2) [place] 3)  [place] [address], [place] [phone]  [phone]   [place] [phone] or [phone] (After Hours)

## 2015-02-07 NOTE — ED Provider Notes (Signed)
CSN: [id number]     Arrival date & time 02/07/15  8295 History   First MD Initiated Contact with Patient 02/07/15 [phone]     Chief Complaint  Patient presents with  . Fever     (Consider location/radiation/quality/duration/timing/severity/associated sxs/prior Treatment) HPI Comments: Here with fever and right sided throat pain for past 1 day. Mild associated nausea.   Patient is a 24 y.o. female presenting with fever. The history is provided by the patient.  Fever Temp source:  Oral Severity:  Moderate Onset quality:  Gradual Duration:  1 day Timing:  Constant Progression:  Unchanged Chronicity:  New Associated symptoms: nausea and sore throat   Associated symptoms: no chest pain, no congestion, no cough, no rhinorrhea and no vomiting   Sore throat:    Severity:  Moderate   Onset quality:  Gradual   Duration:  1 day   Timing:  Constant   Progression:  Unchanged Risk factors: no hx of cancer     History reviewed. No pertinent past medical history. Past Surgical History  Procedure Laterality Date  . Cesarean section     History reviewed. No pertinent family history. Social History  Substance Use Topics  . Smoking status: Never Smoker   . Smokeless tobacco: None  . Alcohol Use: No   OB History    Gravida Para Term Preterm AB TAB SAB Ectopic Multiple Living   1              Review of Systems  Constitutional: Positive for fever.  HENT: Positive for sore throat. Negative for congestion and rhinorrhea.   Respiratory: Negative for cough and shortness of breath.   Cardiovascular: Negative for chest pain.  Gastrointestinal: Positive for nausea. Negative for vomiting and abdominal pain.  All other systems reviewed and are negative.     Allergies  Ibuprofen  Home Medications   Prior to Admission medications   Not on File   BP 105/60 mmHg  Pulse 124  Temp(Src) 103.2 F (39.6 C) (Oral)  Resp 18  Ht  (1.702 m)  Wt 219 lb (99.338 kg)  BMI 34.29 kg/m2   SpO2 98%  LMP 01/29/2015 Physical Exam  Constitutional: She is oriented to person, place, and time. She appears well-developed and well-nourished. No distress.  HENT:  Head: Normocephalic and atraumatic.  Mouth/Throat: Oropharyngeal exudate (R tonsil), posterior oropharyngeal edema (bilateral tonsils swollen and erythematous. Right tonsil with exudates) and posterior oropharyngeal erythema present. No tonsillar abscesses.  Eyes: EOM are normal. Pupils are equal, round, and reactive to light.  Neck: Normal range of motion. Neck supple.  Cardiovascular: Normal rate and regular rhythm.  Exam reveals no friction rub.   No murmur heard. Pulmonary/Chest: Effort normal and breath sounds normal. No respiratory distress. She has no wheezes. She has no rales.  Abdominal: Soft. She exhibits no distension. There is no tenderness. There is no rebound.  Musculoskeletal: Normal range of motion. She exhibits no edema.  Neurological: She is alert and oriented to person, place, and time.  Skin: No rash noted. She is not diaphoretic.  Nursing note and vitals reviewed.   ED Course  Procedures (including critical care time) Labs Review Labs Reviewed  RAPID STREP SCREEN (NOT AT [facility]) - Abnormal; Notable for the following:    Streptococcus, Group A Screen (Direct) POSITIVE (*)    All other components within normal limits    Imaging Review No results found. I have personally reviewed and evaluated these images and lab results as part of  my medical decision-making.   EKG Interpretation None      MDM   Final diagnoses:  Strep pharyngitis    6F here with sore throat, fever. R sided tonsillar exudates with bilateral tonsillar swelling. No stridor. Airway is patent. Exam not c/w PTA. Neck supple with full ROM. No concern for meningitis. No ear pathology noted. Will swab for strep. Decadron given. Strep screen sent. I offered fluids since she is having throat pain and pain with swallowing, however she  refused.  Strep positive. Bicillin given.  Elwin MochaBlair Darlina Mccaughey, MD 02/07/15 [phone]

## 2015-02-07 NOTE — ED Notes (Signed)
Pt amb to room 7 with quick steady gait in nad, pt reports sore throat and fever x yesterday. Denies cough or congestion.

## 2016-10-28 ENCOUNTER — Encounter (HOSPITAL_BASED_OUTPATIENT_CLINIC_OR_DEPARTMENT_OTHER): Payer: Self-pay | Admitting: *Deleted

## 2016-10-28 ENCOUNTER — Emergency Department (HOSPITAL_BASED_OUTPATIENT_CLINIC_OR_DEPARTMENT_OTHER)
Admission: EM | Admit: 2016-10-28 | Discharge: 2016-10-28 | Disposition: A | Discharge status: Home / Self Care | Payer: Medicaid Other | Attending: Emergency Medicine | Admitting: Emergency Medicine

## 2016-10-28 DIAGNOSIS — Y999 Unspecified external cause status: Secondary | ICD-10-CM | POA: Insufficient documentation

## 2016-10-28 DIAGNOSIS — S40912A Unspecified superficial injury of left shoulder, initial encounter: Secondary | ICD-10-CM | POA: Diagnosis not present

## 2016-10-28 DIAGNOSIS — S46012A Strain of muscle(s) and tendon(s) of the rotator cuff of left shoulder, initial encounter: Secondary | ICD-10-CM | POA: Insufficient documentation

## 2016-10-28 DIAGNOSIS — Y929 Unspecified place or not applicable: Secondary | ICD-10-CM | POA: Insufficient documentation

## 2016-10-28 DIAGNOSIS — M25512 Pain in left shoulder: Secondary | ICD-10-CM | POA: Diagnosis present

## 2016-10-28 DIAGNOSIS — X58XXXA Exposure to other specified factors, initial encounter: Secondary | ICD-10-CM | POA: Insufficient documentation

## 2016-10-28 DIAGNOSIS — O9989 Other specified diseases and conditions complicating pregnancy, childbirth and the puerperium: Secondary | ICD-10-CM | POA: Insufficient documentation

## 2016-10-28 DIAGNOSIS — Z3A08 8 weeks gestation of pregnancy: Secondary | ICD-10-CM | POA: Diagnosis not present

## 2016-10-28 DIAGNOSIS — Y939 Activity, unspecified: Secondary | ICD-10-CM | POA: Insufficient documentation

## 2016-10-28 DIAGNOSIS — S46812A Strain of other muscles, fascia and tendons at shoulder and upper arm level, left arm, initial encounter: Secondary | ICD-10-CM

## 2016-10-28 MED ORDER — LIDOCAINE 5 % EX PTCH: 1.0000 | MEDICATED_PATCH | CUTANEOUS | 0 refills | Status: AC

## 2016-10-28 NOTE — ED Provider Notes (Signed)
[facility] Provider Note   CSN: [id number] Arrival date & time: 10/28/16  1142     History   Chief Complaint Chief Complaint  Patient presents with  . Shoulder Pain    HPI Molly Sawyer is a 26 y.o. female.  HPI   Molly Sawyer is a 26 y.o. female, patient with history of G3P2, presenting to the ED with left shoulder pain present for the past 4 days. States she woke up with the pain. Has been constant, not getting worse or better. Worse with movement of the shoulder and turning head to the left. States it feels like a tightness in the neck and sharp pain in the shoulder. Rates it 8/10, nonradiating. LMP May 18, states she knows she is pregnant. Has OBGYN appt in two days. 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  Denies fever/chills, swelling, neuro deficits, shortness of breath, chest pain, trauma/falls, or any other complaints. No history of PE/DVT, recent immobilization, trauma, surgery.      History reviewed. No pertinent past medical history.  There are no active problems to display for this patient.   Past Surgical History:  Procedure Laterality Date  . CESAREAN SECTION      OB History    Gravida Para Term Preterm AB Living   2             SAB TAB Ectopic Multiple Live Births                   Home Medications    Prior to Admission medications   Medication Sig Start Date End Date Taking? Authorizing Provider  lidocaine (LIDODERM) 5 % Place 1 patch onto the skin daily. Remove & Discard patch within 12 hours or as directed by MD 10/28/16   Anselm Pancoast, PA-C    Family History No family history on file.  Social History Social History  Substance Use Topics  . Smoking status: Never Smoker  . Smokeless tobacco: Never Used  . Alcohol use No     Allergies   Ibuprofen   Review of Systems Review of Systems  Constitutional: Negative for chills, diaphoresis and  fever.  Respiratory: Negative for shortness of breath.   Cardiovascular: Negative for chest pain.  Gastrointestinal: Negative for abdominal pain, nausea and vomiting.  Musculoskeletal: Positive for arthralgias and neck pain. Negative for joint swelling.  Skin: Negative for rash.  Neurological: Negative for dizziness, weakness, light-headedness, numbness and headaches.  All other systems reviewed and are negative.    Physical Exam Updated Vital Signs BP 140/70 (BP Location: Right Arm)   Pulse (!) 101   Temp 98.6 F (37 C) (Oral)   Resp 18   Ht 5\' 7"  (1.702 m)   Wt 99.8 kg (220 lb)   SpO2 100%   BMI 34.46 kg/m   Physical Exam  Constitutional: She appears well-developed and well-nourished. No distress.  HENT:  Head: Normocephalic and atraumatic.  Eyes: Pupils are equal, round, and reactive to light. Conjunctivae and EOM are normal.  Neck: Normal range of motion. Neck supple.  Cardiovascular: Normal rate, regular rhythm, normal heart sounds and intact distal pulses.   Pulmonary/Chest: Effort normal and breath sounds normal. No respiratory distress.  Abdominal: Soft. There is no tenderness. There is no guarding.  Musculoskeletal: She exhibits no edema.  Tenderness to the left trapezius. Full passive and active range of motion in the neck and left shoulder. Increased  pain with rotation of the neck to the left and with the patient's left arm across her chest. Upper extremities are normal temperature with no noted swelling, increased warmth, or erythema. Pulses are equal bilaterally. Normal motor function intact in all other extremities and spine. No midline spinal tenderness.   Lymphadenopathy:    She has no cervical adenopathy.  Neurological: She is alert.  No sensory deficits. Strength 5/5 in all extremities. No gait disturbance. Coordination intact including heel to shin and finger to nose. Cranial nerves III-XII grossly intact. No facial droop.   Skin: Skin is warm and dry.  Capillary refill takes less than 2 seconds. She is not diaphoretic.  Psychiatric: She has a normal mood and affect. Her behavior is normal.  Nursing note and vitals reviewed.    ED Treatments / Results  Labs (all labs ordered are listed, but only abnormal results are displayed) Labs Reviewed - No data to display  EKG  EKG Interpretation None       Radiology No results found.  Procedures Procedures (including critical care time)  Medications Ordered in ED Medications - No data to display   Initial Impression / Assessment and Plan / ED Course  I have reviewed the triage vital signs and the nursing notes.  Pertinent labs & imaging results that were available during my care of the patient were reviewed by me and considered in my medical decision making (see chart for details).     Patient presents with left shoulder pain. Presentation suggestive of trapezius strain. More serious issues such as PE or DVT were considered, but history and presentation are not suggestive. Patient has close follow-up. The patient was given instructions for home care as well as return precautions. Patient voices understanding of these instructions, accepts the plan, and is comfortable with discharge.  Final Clinical Impressions(s) / ED Diagnoses   Final diagnoses:  Strain of left trapezius muscle, initial encounter    New Prescriptions Discharge Medication List as of 10/28/2016  1:06 PM    START taking these medications   Details  lidocaine (LIDODERM) 5 % Place 1 patch onto the skin daily. Remove & Discard patch within 12 hours or as directed by MD, Starting Mon 10/28/2016, Print         Liev Brockbank, [place] CityShawn C, PA-C 10/29/16 0934    Anselm PancoastJoy, Nikiah Goin C, PA-C 10/29/16 [id number]    Loren RacerYelverton, David, MD 10/30/16 [phone]

## 2016-10-28 NOTE — ED Triage Notes (Signed)
[redacted] weeks pregnant. Here today for pain in her left shoulder and neck x 4 days. Unable to turn her head due to pain.

## 2016-10-28 NOTE — Discharge Instructions (Signed)
Take it easy, but do not lay around too much as this may make any stiffness worse.  Tylenol: May take tylenol as needed for pain. Your daily total maximum amount of tylenol from all sources should be limited to 4000mg /day for persons without liver problems, or 2000mg /day for those with liver problems. Lidocaine patches: These are available via either prescription or over-the-counter. The over-the-counter option may be more economical one and are likely just as effective. There are multiple over-the-counter brands, such as Salonpas. Exercises: Be sure to perform the attached exercises starting with three times a week and working up to performing them daily. This is an essential part of preventing long term problems. Note that the attached exercise sheet speaks of a cervical strain/sprain. This is not your diagnosis today. It has been included solely for the exercises.  Heat: May try applying a heating pad to improve tightness.  Follow up with a primary care provider for any future management of these complaints.

## 2016-11-06 ENCOUNTER — Encounter (HOSPITAL_BASED_OUTPATIENT_CLINIC_OR_DEPARTMENT_OTHER): Payer: Self-pay | Admitting: *Deleted

## 2016-11-06 ENCOUNTER — Emergency Department (HOSPITAL_BASED_OUTPATIENT_CLINIC_OR_DEPARTMENT_OTHER)
Admission: EM | Admit: 2016-11-06 | Discharge: 2016-11-06 | Disposition: A | Discharge status: Home / Self Care | Payer: Medicaid Other | Attending: Emergency Medicine | Admitting: Emergency Medicine

## 2016-11-06 DIAGNOSIS — Z3A09 9 weeks gestation of pregnancy: Secondary | ICD-10-CM | POA: Diagnosis not present

## 2016-11-06 DIAGNOSIS — O9989 Other specified diseases and conditions complicating pregnancy, childbirth and the puerperium: Secondary | ICD-10-CM | POA: Insufficient documentation

## 2016-11-06 DIAGNOSIS — Z79899 Other long term (current) drug therapy: Secondary | ICD-10-CM | POA: Diagnosis not present

## 2016-11-06 DIAGNOSIS — M542 Cervicalgia: Secondary | ICD-10-CM | POA: Diagnosis not present

## 2016-11-06 DIAGNOSIS — M436 Torticollis: Secondary | ICD-10-CM | POA: Diagnosis not present

## 2016-11-06 HISTORY — DX: Encounter for supervision of normal pregnancy, unspecified, unspecified trimester: Z34.90

## 2016-11-06 NOTE — ED Provider Notes (Signed)
[facility]   CSN: [id number] Arrival date & time: 11/06/16  [id number]     History   Chief Complaint Chief Complaint  Patient presents with  . Neck Pain    HPI Felina Ambs is a 26 y.o. female [redacted] weeks pregnant presenting with left-sided neck pain onset 1 week ago when she woke up. She was previously seen in the emergency Department for the same and prescribed lidocaine patches. 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 Pain is exacerbated by pushing furniture, reaching and bending over. She denies numbness, weakness, swelling or other symptoms. She states that it has been the same pain and location since her last ED visit. No new complaints.  HPI  Past Medical History:  Diagnosis Date  . Pregnancy     There are no active problems to display for this patient.   Past Surgical History:  Procedure Laterality Date  . CESAREAN SECTION      OB History    Gravida Para Term Preterm AB Living   2             SAB TAB Ectopic Multiple Live Births                   Home Medications    Prior to Admission medications   Medication Sig Start Date End Date Taking? Authorizing Provider  acetaminophen (TYLENOL) 500 MG tablet Take 1,000 mg by mouth every 6 (six) hours as needed.   Yes [provider]  lidocaine (LIDODERM) 5 % Place 1 patch onto the skin daily. Remove & Discard patch within 12 hours or as directed by MD 10/28/16  Yes Joy, Shawn C, PA-C  Prenatal Vit-Fe Fumarate-FA (PRENATAL MULTIVITAMIN) TABS tablet Take 1 tablet by mouth daily at 12 noon.   Yes [provider]    Family History No family history on file.  Social History Social History  Substance Use Topics  . Smoking status: Never Smoker  . Smokeless tobacco: Never Used  . Alcohol use No     Allergies   Ibuprofen   Review of Systems Review of  Systems  Constitutional: Negative for chills and fever.  HENT: Negative for ear pain, facial swelling, sore throat and trouble swallowing.   Eyes: Negative for pain and visual disturbance.  Respiratory: Negative for cough, shortness of breath, wheezing and stridor.   Cardiovascular: Negative for chest pain and palpitations.  Gastrointestinal: Negative for abdominal pain, nausea and vomiting.  Musculoskeletal: Positive for myalgias and neck pain. Negative for arthralgias, back pain, gait problem and joint swelling.  Skin: Negative for color change, pallor, rash and wound.  Neurological: Negative for dizziness, seizures, syncope, facial asymmetry, weakness, light-headedness, numbness and headaches.     Physical Exam Updated Vital Signs BP 120/67 (BP Location: Right Arm)   Pulse 77   Temp 98.2 F (36.8 C) (Oral)   Resp 18   Ht 5\' 7"  (1.702 m)   Wt 100.7 kg (222 lb)   SpO2 99%   BMI 34.77 kg/m   Physical Exam  Constitutional: She appears well-developed and well-nourished. No distress.  Afebrile, nontoxic-appearing, sitting comfortably in bed in no acute distress.  HENT:  Head: Normocephalic and atraumatic.  Eyes: Conjunctivae and EOM are normal.  Neck: Normal range of motion. Neck supple.  Cardiovascular: Normal rate, regular rhythm, normal heart sounds and intact  distal pulses.   No murmur heard. Pulmonary/Chest: Effort normal and breath sounds normal. No stridor. No respiratory distress. She has no wheezes. She has no rales. She exhibits no tenderness.  Abdominal: She exhibits no distension.  Musculoskeletal: Normal range of motion. She exhibits tenderness. She exhibits no edema or deformity.  Tenderness to palpation along the left trapezius muscle. Full range of motion of the neck, shoulder flexion/extension/abduction/adduction. No midline tenderness to palpation of the spine  Lymphadenopathy:    She has no cervical adenopathy.  Neurological: She is alert. No sensory deficit.  She exhibits normal muscle tone.  5 out of 5 strength to grips bilaterally, shoulder abduction and adduction, shoulder shrug. Neurovascularly intact distally  Skin: Skin is warm and dry. Capillary refill takes less than 2 seconds. No rash noted. She is not diaphoretic. No erythema. No pallor.  Psychiatric: She has a normal mood and affect.  Nursing note and vitals reviewed.    ED Treatments / Results  Labs (all labs ordered are listed, but only abnormal results are displayed) Labs Reviewed - No data to display  EKG  EKG Interpretation None       Radiology No results found.  Procedures Procedures (including critical care time)  Medications Ordered in ED Medications - No data to display   Initial Impression / Assessment and Plan / ED Course  I have reviewed the triage vital signs and the nursing notes.  Pertinent labs & imaging results that were available during my care of the patient were reviewed by me and considered in my medical decision making (see chart for details).    Patient presents with torticolis symptoms with TTP of left trapezius muscle exacerbated by working as a Advertising copywriterhousekeeper, reaching, pulling furniture and bending down.  Overall reassuring exam, full range of motion, neurovascularly intact. No new symptoms from last ED visit. Patient reporting that her noted expired to be on light duty and now that she is back on normal duty her pain has worsened.  She is [redacted] weeks pregnant and analgesia options are limited. Encouraged conservative management, heat, massage, gentle stretching and provided resources. Continue tylenol and lidocaine patch as needed. Patient has an appointment with her OB/GYN this afternoon. Urged to discuss further analgesia options and work restrictions with her provider if symptoms persist.  Discussed strict return precautions and advised to return to the emergency department if experiencing any new or worsening symptoms. Instructions were  understood and patient agreed with discharge plan.  Final Clinical Impressions(s) / ED Diagnoses   Final diagnoses:  Torticollis    New Prescriptions New Prescriptions   No medications on file     Gregary CromerMitchell, Tyshae Stair B, PA-C 11/06/16 1106    Cathren LaineSteinl, Kevin, MD 11/06/16 1215

## 2016-11-06 NOTE — ED Triage Notes (Signed)
Pt seen here last night for neck and shoulder pain. Pain worse in the last day. Unable to turn head from side to side without pain. Pt is [redacted]wks pregnant

## 2016-11-06 NOTE — Discharge Instructions (Signed)
As discussed, continue with lidocaine patches and tylenol. Use heat, massage, gentle stretch to relief symptoms. Discuss this with your OB/GYN at your appointment regarding work modifications and restrictions.

## 2016-11-07 DIAGNOSIS — M7542 Impingement syndrome of left shoulder: Secondary | ICD-10-CM | POA: Insufficient documentation

## 2017-02-05 ENCOUNTER — Encounter (HOSPITAL_BASED_OUTPATIENT_CLINIC_OR_DEPARTMENT_OTHER): Payer: Self-pay | Admitting: *Deleted

## 2017-02-05 ENCOUNTER — Emergency Department (HOSPITAL_BASED_OUTPATIENT_CLINIC_OR_DEPARTMENT_OTHER)
Admission: EM | Admit: 2017-02-05 | Discharge: 2017-02-05 | Disposition: A | Discharge status: Home / Self Care | Payer: Medicaid Other | Attending: Emergency Medicine | Admitting: Emergency Medicine

## 2017-02-05 DIAGNOSIS — R1084 Generalized abdominal pain: Secondary | ICD-10-CM | POA: Diagnosis not present

## 2017-02-05 DIAGNOSIS — R109 Unspecified abdominal pain: Secondary | ICD-10-CM

## 2017-02-05 DIAGNOSIS — R197 Diarrhea, unspecified: Secondary | ICD-10-CM | POA: Diagnosis not present

## 2017-02-05 DIAGNOSIS — O9989 Other specified diseases and conditions complicating pregnancy, childbirth and the puerperium: Secondary | ICD-10-CM | POA: Diagnosis present

## 2017-02-05 LAB — URINALYSIS, ROUTINE W REFLEX MICROSCOPIC
Bilirubin Urine: NEGATIVE
Glucose, UA: NEGATIVE mg/dL
Hgb urine dipstick: NEGATIVE
Ketones, ur: NEGATIVE mg/dL
LEUKOCYTES UA: NEGATIVE
Nitrite: NEGATIVE
PROTEIN: NEGATIVE mg/dL
Specific Gravity, Urine: 1.015 (ref 1.005–1.030)
pH: 6.5 (ref 5.0–8.0)

## 2017-02-05 LAB — WET PREP, GENITAL
CLUE CELLS WET PREP: NONE SEEN
SPERM: NONE SEEN
Trich, Wet Prep: NONE SEEN

## 2017-02-05 LAB — CBC WITH DIFFERENTIAL/PLATELET
BASOS ABS: 0 10*3/uL (ref 0.0–0.1)
BASOS PCT: 0 %
Eosinophils Absolute: 0.2 10*3/uL (ref 0.0–0.7)
Eosinophils Relative: 2 %
HCT: 31.3 % — ABNORMAL LOW (ref 36.0–46.0)
HEMOGLOBIN: 10.3 g/dL — AB (ref 12.0–15.0)
Lymphocytes Relative: 27 %
Lymphs Abs: 2.9 10*3/uL (ref 0.7–4.0)
MCH: 29.4 pg (ref 26.0–34.0)
MCHC: 32.9 g/dL (ref 30.0–36.0)
MCV: 89.4 fL (ref 78.0–100.0)
Monocytes Absolute: 0.7 10*3/uL (ref 0.1–1.0)
Monocytes Relative: 6 %
Neutro Abs: 7 10*3/uL (ref 1.7–7.7)
Neutrophils Relative %: 65 %
Platelets: 249 10*3/uL (ref 150–400)
RBC: 3.5 MIL/uL — ABNORMAL LOW (ref 3.87–5.11)
RDW: 12 % (ref 11.5–15.5)
WBC: 10.8 10*3/uL — AB (ref 4.0–10.5)

## 2017-02-05 LAB — COMPREHENSIVE METABOLIC PANEL
ALK PHOS: 44 U/L (ref 38–126)
ALT: 14 U/L (ref 14–54)
AST: 16 U/L (ref 15–41)
Albumin: 3.4 g/dL — ABNORMAL LOW (ref 3.5–5.0)
Anion gap: 6 (ref 5–15)
BUN: 5 mg/dL — AB (ref 6–20)
CO2: 24 mmol/L (ref 22–32)
Calcium: 9.2 mg/dL (ref 8.9–10.3)
Chloride: 105 mmol/L (ref 101–111)
Creatinine, Ser: 0.47 mg/dL (ref 0.44–1.00)
GFR calc Af Amer: >60 mL/min (ref >60)
GFR calc non Af Amer: >60 mL/min (ref >60)
GLUCOSE: 96 mg/dL (ref 65–99)
POTASSIUM: 3.9 mmol/L (ref 3.5–5.1)
Sodium: 135 mmol/L (ref 135–145)
TOTAL PROTEIN: 7.3 g/dL (ref 6.5–8.1)
Total Bilirubin: 0.2 mg/dL — ABNORMAL LOW (ref 0.3–1.2)

## 2017-02-05 LAB — LIPASE, BLOOD: Lipase: 21 U/L (ref 11–51)

## 2017-02-05 MED ORDER — ACETAMINOPHEN 500 MG PO TABS: 1000.0000 mg | ORAL_TABLET | Freq: Once | ORAL | Status: DC

## 2017-02-05 NOTE — ED Triage Notes (Signed)
Pt is ~22 wks preg. Reports sharp lower abd pain approx 20 mins pta. States she has 3 loose BM this am. Denies vaginal bleeding or fluid leaking from vagina

## 2017-02-05 NOTE — ED Notes (Signed)
Dr Alvester Morinnewton informed of pt status, FHR, no uc's noted on tracing. Pt amy come off monitor. [facility]

## 2017-02-05 NOTE — Discharge Instructions (Signed)

## 2017-02-05 NOTE — ED Provider Notes (Addendum)
[facility] EMERGENCY DEPARTMENT Provider Note  CSN: [id number] Arrival date & time: 02/05/17 [id number]  Chief Complaint(s) Abdominal Pain (pregnant [redacted]wk)  HPI Molly Sawyer is a 26 y.o. female who is [redacted] weeks pregnant via prenatal ultrasound.   The history is provided by the patient.  Abdominal Pain   This is a new problem. The current episode started 1 to 2 hours ago. Episode frequency: intermittent. Progression since onset: flutuating. The pain is associated with an unknown factor. The pain is located in the suprapubic region. The quality of the pain is sharp. The pain is moderate. Associated symptoms include diarrhea (had 3 watery BM this am; NB) and frequency. Pertinent negatives include anorexia, fever, hematochezia, melena, nausea, vomiting, constipation, dysuria, hematuria and myalgias. Nothing aggravates the symptoms. Nothing relieves the symptoms.   Patient reports eating [place] fried chicken last night but states other people in the family did not get sick from it.  No recent travels, recent antibiotic use.  The patient is still having sexual relations but the last time this occurred was 1 month ago.  She denies any vaginal bleeding, but does endorse baseline vaginal discharge which she has had all her life; no change in discharge consistency, amount.   Past Medical History Past Medical History:  Diagnosis Date  . Pregnancy    There are no active problems to display for this patient.  Home Medication(s) Prior to Admission medications   Medication Sig Start Date End Date Taking? Authorizing Provider  acetaminophen (TYLENOL) 500 MG tablet Take 1,000 mg by mouth every 6 (six) hours as needed.   Yes [provider]  Prenatal Vit-Fe Fumarate-FA (PRENATAL MULTIVITAMIN) TABS tablet Take 1 tablet by mouth daily at 12 noon.   Yes [provider]  lidocaine (LIDODERM) 5 % Place 1 patch onto the skin daily. Remove & Discard patch within 12 hours or as directed  by MD 10/28/16   Anselm PancoastJoy, Shawn C, PA-C                                                                                                                                    Past Surgical History Past Surgical History:  Procedure Laterality Date  . CESAREAN SECTION     Family History No family history on file.  Social History Social History  Substance Use Topics  . Smoking status: Never Smoker  . Smokeless tobacco: Never Used  . Alcohol use No   Allergies Ibuprofen  Review of Systems Review of Systems  Constitutional: Negative for fever.  Gastrointestinal: Positive for abdominal pain and diarrhea (had 3 watery BM this am; NB). Negative for anorexia, constipation, hematochezia, melena, nausea and vomiting.  Genitourinary: Positive for frequency. Negative for dysuria and hematuria.  Musculoskeletal: Negative for myalgias.    Physical Exam Vital Signs  I have reviewed the triage vital signs BP 126/76 (BP Location: Left Arm)   Pulse 96   Temp (!) 97.5  F (36.4 C) (Oral)   Resp 18   Ht 5\' 7"  (1.702 m)   Wt 107.5 kg (236 lb 15.9 oz)   SpO2 96%   BMI 37.12 kg/m   Physical Exam  Constitutional: She is oriented to person, place, and time. She appears well-developed and well-nourished. No distress.  HENT:  Head: Normocephalic and atraumatic.  Nose: Nose normal.  Eyes: Pupils are equal, round, and reactive to light. Conjunctivae and EOM are normal. Right eye exhibits no discharge. Left eye exhibits no discharge. No scleral icterus.  Neck: Normal range of motion. Neck supple.  Cardiovascular: Normal rate and regular rhythm.  Exam reveals no gallop and no friction rub.   No murmur heard. Pulmonary/Chest: Effort normal and breath sounds normal. No stridor. No respiratory distress. She has no rales.  Abdominal: Soft. She exhibits no distension. There is tenderness in the suprapubic area. There is no rigidity, no rebound, no guarding, no CVA tenderness and no tenderness at McBurney's  point. No hernia. Hernia confirmed negative in the right inguinal area and confirmed negative in the left inguinal area.  Gravid  Genitourinary: Pelvic exam was performed with patient supine. There is no rash, tenderness or lesion on the right labia. There is no rash, tenderness or lesion on the left labia. Uterus is not tender. Cervix exhibits no motion tenderness, no discharge and no friability. Right adnexum displays no tenderness. Left adnexum displays no tenderness. No erythema, tenderness or bleeding in the vagina. Vaginal discharge (mild) found.  Genitourinary Comments: Chaperone present during pelvic exam.  Musculoskeletal: She exhibits no edema or tenderness.  Neurological: She is alert and oriented to person, place, and time.  Skin: Skin is warm and dry. No rash noted. She is not diaphoretic. No erythema.  Psychiatric: She has a normal mood and affect.  Vitals reviewed.   ED Results and Treatments Labs (all labs ordered are listed, but only abnormal results are displayed) Labs Reviewed  WET PREP, GENITAL - Abnormal; Notable for the following:       Result Value   Yeast Wet Prep HPF POC PRESENT (*)    WBC, Wet Prep HPF POC MODERATE (*)    All other components within normal limits  CBC WITH DIFFERENTIAL/PLATELET - Abnormal; Notable for the following:    WBC 10.8 (*)    RBC 3.50 (*)    Hemoglobin 10.3 (*)    HCT 31.3 (*)    All other components within normal limits  COMPREHENSIVE METABOLIC PANEL - Abnormal; Notable for the following:    BUN 5 (*)    Albumin 3.4 (*)    Total Bilirubin 0.2 (*)    All other components within normal limits  URINALYSIS, ROUTINE W REFLEX MICROSCOPIC  LIPASE, BLOOD  GC/CHLAMYDIA PROBE AMP ([place]) NOT AT [facility]                                                                                                                         EKG  EKG Interpretation  Date/Time:    Ventricular Rate:    PR Interval:    QRS Duration:   QT Interval:      QTC Calculation:   R Axis:     Text Interpretation:        Radiology No results found. Pertinent labs & imaging results that were available during my care of the patient were reviewed by me and considered in my medical decision making (see chart for details).  Medications Ordered in ED Medications  acetaminophen (TYLENOL) tablet 1,000 mg (not administered)                                                                                                                                    Procedures Procedures  (including critical care time)  Medical Decision Making / ED Course I have reviewed the nursing notes for this encounter and the patient's prior records (if available in EHR or on provided paperwork).     FHT at 170s.  Intermittent lower abdominal sharp/cramping pains.  Patient is afebrile with stable vital signs.  She is well hydrated, well-appearing, nontoxic.  Exam with suprapubic abdominal discomfort without evidence of peritonitis.  Pelvic exam with mild vaginal discharge without evidence of cervicitis or PID.  Cervix is closed.  Labs grossly reassuring with baseline mild leukocytosis.  No biliary obstruction, evidence of pancreatitis.  No evidence of urinary tract infection.  Wet prep positive for yeast.  No evidence of bacterial vaginosis or trichomonas.  Low suspicion for serious intra-abdominal inflammatory/infectious process or small bowel obstruction.  Low suspicion for premature, preterm labor or threatened miscarriage.  Likely GI colic related pain given recent diarrhea versus Braxton Hicks versus yeast infection pain versus pain from fetal movement.   Do not feel that advanced imaging is warranted at this time.   The patient is safe for discharge with strict return precautions.   Final Clinical Impression(s) / ED Diagnoses Final diagnoses:  Abdominal cramping   Disposition: Discharge  Condition: Good  I have discussed the results, Dx and Tx plan with the  patient who expressed understanding and agree(s) with the plan. Discharge instructions discussed at great length. The patient was given strict return precautions who verbalized understanding of the instructions. No further questions at time of discharge.    New Prescriptions   No medications on file    Follow Up: OB/GYN  Schedule an appointment as soon as possible for a visit  in 3-5 days, If symptoms do not improve or  worsen      This chart was dictated using voice recognition software.  Despite best efforts to proofread,  errors can occur which can change the documentation meaning.     Nira Conn, MD 02/05/17 [phone]

## 2017-02-06 LAB — GC/CHLAMYDIA PROBE AMP (~~LOC~~) NOT AT ARMC
Chlamydia: NEGATIVE
NEISSERIA GONORRHEA: NEGATIVE

## 2017-10-21 ENCOUNTER — Other Ambulatory Visit: Payer: Self-pay

## 2017-10-21 ENCOUNTER — Ambulatory Visit: Payer: Medicaid Other | Admitting: Podiatry

## 2018-02-18 ENCOUNTER — Encounter (HOSPITAL_BASED_OUTPATIENT_CLINIC_OR_DEPARTMENT_OTHER): Payer: Self-pay | Admitting: Emergency Medicine

## 2018-02-18 ENCOUNTER — Emergency Department (HOSPITAL_BASED_OUTPATIENT_CLINIC_OR_DEPARTMENT_OTHER)
Admission: EM | Admit: 2018-02-18 | Discharge: 2018-02-18 | Disposition: A | Discharge status: Home / Self Care | Payer: Self-pay | Attending: Emergency Medicine | Admitting: Emergency Medicine

## 2018-02-18 ENCOUNTER — Other Ambulatory Visit: Payer: Self-pay

## 2018-02-18 ENCOUNTER — Emergency Department (HOSPITAL_BASED_OUTPATIENT_CLINIC_OR_DEPARTMENT_OTHER): Payer: Self-pay

## 2018-02-18 DIAGNOSIS — Z79899 Other long term (current) drug therapy: Secondary | ICD-10-CM | POA: Insufficient documentation

## 2018-02-18 DIAGNOSIS — T7840XA Allergy, unspecified, initial encounter: Secondary | ICD-10-CM | POA: Insufficient documentation

## 2018-02-18 LAB — PREGNANCY, URINE: PREG TEST UR: NEGATIVE

## 2018-02-18 MED ORDER — SODIUM CHLORIDE 0.9 % IV SOLN
INTRAVENOUS | Status: DC | PRN
  Administered 2018-02-18: 500 mL via INTRAVENOUS

## 2018-02-18 MED ORDER — FAMOTIDINE 20 MG PO TABS: 20.0000 mg | ORAL_TABLET | Freq: Two times a day (BID) | ORAL | 0 refills | Status: AC

## 2018-02-18 MED ORDER — DIPHENHYDRAMINE HCL 25 MG PO TABS: 25.0000 mg | ORAL_TABLET | Freq: Four times a day (QID) | ORAL | 0 refills | Status: AC

## 2018-02-18 MED ORDER — FAMOTIDINE IN NACL 20-0.9 MG/50ML-% IV SOLN
20.0000 mg | Freq: Once | INTRAVENOUS | Status: AC
  Administered 2018-02-18: 20 mg via INTRAVENOUS
  Filled 2018-02-18: qty 50

## 2018-02-18 MED ORDER — DIPHENHYDRAMINE HCL 50 MG/ML IJ SOLN
50.0000 mg | Freq: Once | INTRAMUSCULAR | Status: AC
  Administered 2018-02-18: 50 mg via INTRAVENOUS
  Filled 2018-02-18: qty 1

## 2018-02-18 MED ORDER — METHYLPREDNISOLONE SODIUM SUCC 125 MG IJ SOLR
125.0000 mg | Freq: Once | INTRAMUSCULAR | Status: AC
  Administered 2018-02-18: 125 mg via INTRAVENOUS
  Filled 2018-02-18: qty 2

## 2018-02-18 NOTE — ED Provider Notes (Signed)
[facility] EMERGENCY DEPARTMENT Provider Note   CSN: [id number] Arrival date & time: 02/18/18  1007     History   Chief Complaint Chief Complaint  Patient presents with  . Facial Swelling    HPI Molly Sawyer is a 27 y.o. female presenting today for facial swelling that began at 4 AM this morning.  Patient states that she has been experiencing a "mild cold" with cough for the past 24 hours.  Patient states that she went out to eat at a restaurant and took Mucinex last night before awakening at 4AM with facial swelling and a "tingling sensation" in her throat.  Patient took a Zyrtec at 4 AM and then attempted to go back to sleep.  Patient states that her symptoms did not improve and she presented to the emergency department today.  Patient denies pain, trouble breathing or trouble swallowing on arrival.  Describes the sensation in her throat as a mild tickling sensation that is constant and not improved following Zyrtec.  Patient states that she feels that her lips as well as eyes are mildly swollen today.  Patient states that she has never experienced this before.  HPI  Past Medical History:  Diagnosis Date  . Pregnancy     Patient Active Problem List   Diagnosis Date Noted  . Impingement syndrome of left shoulder 11/07/2016  . Lower urinary tract infectious disease 12/17/2013  . GBS (group B Streptococcus carrier), +RV culture, currently pregnant 12/01/2013  . Threatened preterm labor 10/26/2013  . 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 Pregnancy, incidental 07/19/2013  . Pregnancy test positive 12/08/2012  . Absence of menstruation 12/08/2012    Past Surgical History:  Procedure Laterality Date  . CESAREAN SECTION       OB History    Gravida  2   Para      Term      Preterm      AB      Living        SAB      TAB      Ectopic     Multiple      Live Births               Home Medications    Prior to Admission medications   Medication Sig Start Date End Date Taking? Authorizing Provider  sertraline (ZOLOFT) 25 MG tablet Take 25 mg by mouth daily.   Yes [provider]  acetaminophen (TYLENOL) 500 MG tablet Take 1,000 mg by mouth every 6 (six) hours as needed.    [provider]  diphenhydrAMINE (BENADRYL) 25 MG tablet Take 1 tablet (25 mg total) by mouth every 6 (six) hours. 02/18/18   Harlene Salts A, PA-C  Doxylamine-Pyridoxine 10-10 MG TBEC Take by mouth.    [provider]  famotidine (PEPCID) 20 MG tablet Take 1 tablet (20 mg total) by mouth 2 (two) times daily. 02/18/18   Harlene Salts A, PA-C  IRON PO Take by mouth.    [provider]  lidocaine (LIDODERM) 5 % Place 1 patch onto the skin daily. Remove & Discard patch within 12 hours or as directed by MD 10/28/16   Anselm Pancoast, PA-C  Prenatal Vit-Fe Fumarate-FA (PRENATAL MULTIVITAMIN) TABS tablet Take 1 tablet by mouth daily at 12 noon.    [provider]    Family History No family history on file.  Social History Social History   Tobacco Use  . Smoking status: Never Smoker  . Smokeless tobacco: Never Used  Substance Use Topics  . Alcohol use: No  . Drug use: No     Allergies   Ibuprofen   Review of Systems Review of Systems  Constitutional: Negative.  Negative for chills, fatigue and fever.  HENT: Positive for congestion, facial swelling and sore throat. Negative for ear pain, rhinorrhea, trouble swallowing and voice change.   Eyes: Negative.  Negative for visual disturbance.  Respiratory: Positive for cough. Negative for shortness of breath.   Cardiovascular: Negative.  Negative for chest pain and leg swelling.  Gastrointestinal: Negative.  Negative for abdominal pain, blood in stool, diarrhea, nausea and vomiting.  Genitourinary: Negative.  Negative for dysuria and hematuria.   Musculoskeletal: Negative.  Negative for arthralgias and myalgias.  Skin: Negative.  Negative for rash.  Neurological: Negative.  Negative for dizziness, weakness and headaches.   Physical Exam Updated Vital Signs BP 121/80 (BP Location: Left Arm)   Pulse 63   Temp 98.4 F (36.9 C) (Oral)   Resp 14   Ht 5\' 7"  (1.702 m)   Wt 86.6 kg   LMP 02/02/2018   SpO2 100%   Breastfeeding? Unknown   BMI 29.91 kg/m   Physical Exam  Constitutional: She appears well-developed and well-nourished. No distress.  HENT:  Head: Normocephalic and atraumatic.  Right Ear: Hearing, tympanic membrane, external ear and ear canal normal. Tympanic membrane is not injected, not perforated, not retracted and not bulging. No middle ear effusion. No hemotympanum.  Left Ear: Hearing, external ear and ear canal normal. Tympanic membrane is injected. Tympanic membrane is not perforated, not retracted and not bulging.  No middle ear effusion. No hemotympanum.  Nose: Mucosal edema and rhinorrhea present.  Mouth/Throat: Uvula is midline, oropharynx is clear and moist and mucous membranes are normal. No trismus in the jaw. No uvula swelling. No oropharyngeal exudate, posterior oropharyngeal edema, posterior oropharyngeal erythema or tonsillar abscesses.  Patient with mild bilateral periorbital swelling.  Patient also endorsing swelling to lips, difficult to tell on initial examination.  The patient has normal phonation and is in control of secretions. No stridor.  Midline uvula without edema. Soft palate rises symmetrically. No tonsillar erythema, swelling or exudates. Tongue protrusion is normal, floor of mouth is soft. No trismus. No creptius on neck palpation. No gingival erythema or fluctuance noted. Mucus membranes moist. No pallor noted.  Mild cobblestoning present consistent with post nasal drip.  Eyes: Pupils are equal, round, and reactive to light. Conjunctivae and EOM are normal.  No pain with extraocular  motion.  Neck: Trachea normal, normal range of motion, full passive range of motion without pain and phonation normal. Neck supple. No tracheal tenderness present. No neck rigidity. No tracheal deviation, no edema and no erythema present.  Cardiovascular:  Pulses:      Radial pulses are 2+ on the right side, and 2+ on the left side.       Dorsalis pedis pulses are 2+ on the right side, and 2+ on the left side.       Posterior tibial pulses are 2+ on the right side, and 2+ on the left side.  Pulmonary/Chest: Effort normal and breath sounds normal. No respiratory distress. She has no decreased breath sounds. She has no wheezes. She exhibits no tenderness, no crepitus and no deformity.  No rash present  Abdominal: Soft. There is no tenderness. There is no rigidity, no rebound and  no guarding.  No rash present  Musculoskeletal: Normal range of motion.       Cervical back: Normal.       Thoracic back: Normal.       Lumbar back: Normal.       Right lower leg: Normal.       Left lower leg: Normal.  Feet:  Right Foot:  Protective Sensation: 3 sites tested. 3 sites sensed.  Left Foot:  Protective Sensation: 3 sites tested. 3 sites sensed.  Neurological: She is alert. GCS eye subscore is 4. GCS verbal subscore is 5. GCS motor subscore is 6.  Speech is clear and goal oriented, follows commands Major Cranial nerves without deficit, no facial droop Normal strength in upper and lower extremities bilaterally including dorsiflexion and plantar flexion, strong and equal grip strength Sensation normal to light touch Moves extremities without ataxia, coordination intact Normal gait  Skin: Skin is warm and dry. Capillary refill takes less than 2 seconds. No rash noted.  Psychiatric: She has a normal mood and affect. Her behavior is normal.   ED Treatments / Results  Labs (all labs ordered are listed, but only abnormal results are displayed) Labs Reviewed  PREGNANCY, URINE     EKG None  Radiology Dg Chest 2 View  Result Date: 02/18/2018 CLINICAL DATA:  27 year old female with facial swelling upon waking today. URI symptoms, recently took Zyrtec and Mucinex. EXAM: CHEST - 2 VIEW COMPARISON:  Chest radiographs 05/31/2014. FINDINGS: Normal lung volumes. Visualized tracheal air column is within normal limits. The heart size and mediastinal contours are within normal limits. Both lungs are clear. Negative visible bowel gas pattern. No osseous abnormality identified. IMPRESSION: Negative.  No cardiopulmonary abnormality. Electronically Signed   By: Odessa Fleming M.D.   On: 02/18/2018 10:48    Procedures Procedures (including critical care time)  Medications Ordered in ED Medications  0.9 %  sodium chloride infusion (500 mLs Intravenous New Bag/Given 02/18/18 1057)  diphenhydrAMINE (BENADRYL) injection 50 mg (50 mg Intravenous Given 02/18/18 1058)  famotidine (PEPCID) IVPB 20 mg premix (0 mg Intravenous Stopped 02/18/18 1142)  methylPREDNISolone sodium succinate (SOLU-MEDROL) 125 mg/2 mL injection 125 mg (125 mg Intravenous Given 02/18/18 1057)     Initial Impression / Assessment and Plan / ED Course  I have reviewed the triage vital signs and the nursing notes.  Pertinent labs & imaging results that were available during my care of the patient were reviewed by me and considered in my medical decision making (see chart for details).  Clinical Course as of Feb 18 1342  Wed Feb 18, 2018  1323 Reassess; resting comfortably, NAD. States that the sensation in her throat has resolved following medications today. Facial swelling has improved.   [BM]    Clinical Course User Index [BM] Elizabeth Palau   Urine Pregnancy and CXR negative.  Stormee Masur is a 27 y.o. female who presents to ED for facial swelling. No known triggers or exposures. No evidence of oral swelling or airway compromise. Lungs are clear bilaterally and vitals stable. No respiratory, GI or  neurologic symptoms to suggest anaphylaxis. Given Benadryl, pepcid, and steroids in ED. Monitored for 3.5 hrs. On re-evaluation, symptoms much improved and patient feels comfortable with discharge to home. Evaluation does not show pathology that would require ongoing emergent intervention or inpatient treatment; patient is hemodynamically stable, NAD and denies feeling of throat closing. Rx for benadryl and pepcid given. Home care instructions and return precautions discussed. Patient states understanding of  return precautions. PCP follow up encouraged. Patient states understanding and is agreeable to plan.  Afebrile, not tachycardic, not hypotensive, well-appearing and in no acute distress.  Additionally patient with mild URI symptoms. Patient's CXR is negative for acute infiltrate. Symptoms are likely of viral etiology. Discussed that antibiotics are not indicated for viral infections. Patient will be discharged with symptomatic treatment. Patient verbalizes understanding and is agreeable with plan. Patient is hemodynamically stable and in no acute distress prior to discharge.  At this time there does not appear to be any evidence of an acute emergency medical condition and the patient appears stable for discharge with appropriate outpatient follow up. Diagnosis was discussed with patient who verbalizes understanding of care plan and is agreeable to discharge. I have discussed return precautions with patient who verbalizes understanding of return precautions. Patient strongly encouraged to follow-up with their PCP. All questions answered.  Patient's case discussed with Dr. Patria Mane who agrees with plan to discharge with follow-up.    Note: Portions of this report may have been transcribed using voice recognition software. Every effort was made to ensure accuracy; however, inadvertent computerized transcription errors may still be present.  Final Clinical Impressions(s) / ED Diagnoses   Final  diagnoses:  Allergic reaction, initial encounter    ED Discharge Orders         Ordered    diphenhydrAMINE (BENADRYL) 25 MG tablet  Every 6 hours     02/18/18 1340    famotidine (PEPCID) 20 MG tablet  2 times daily     02/18/18 [address] 02/18/18 1352    Azalia Bilis, MD 02/18/18 1436

## 2018-02-18 NOTE — ED Notes (Signed)
Pt unable to void at this time. 

## 2018-02-18 NOTE — ED Triage Notes (Signed)
Pt states she woke up this morning with facial swelling. She states she took a zyrtec and mucinex yesterday for URI symptoms and is possibly having a reaction.

## 2018-02-18 NOTE — Discharge Instructions (Addendum)
You have been diagnosed today with allergic reaction with unknown cause.  At this time there does not appear to be the presence of an emergent medical condition, however there is always the potential for conditions to worsen. Please read and follow the below instructions.  Please return to the Emergency Department immediately for any new or worsening symptoms or if your symptoms do not improve. Please be sure to follow up with your Primary Care Provider as soon as possible regarding your visit today; please call their office to schedule an appointment even if you are feeling better for a follow-up visit. Please continue to take Benadryl and Pepcid as prescribed for the next few days and avoid possible causes for your allergic reaction.  These medications may make you drowsy so avoid driving while taking them. Please follow-up with your primary care provider for further allergy testing to figure out the cause of your reaction today.   Return to the emergency department immediately for recurrent allergic reactions.  Get help right away if: You have symptoms of anaphylaxis, such as: Swollen mouth, tongue, or throat. Pain or tightness in your chest. Trouble breathing or shortness of breath. Dizziness or fainting. Severe abdominal pain, vomiting, or diarrhea. Get help right away if: You have any of these: A tight feeling in your chest or your throat. Loud breathing. Trouble with breathing. Itchy, red, swollen areas of skin. Red skin or itching all over your body. Swelling in your lips, tongue, or the back of your throat. You have throwing up that gets very bad. You have watery poop that gets very bad. You pass out or feel like you might pass out.  Please read the additional information packets attached to your discharge summary.  Do not take your medicine if  develop an itchy rash, swelling in your mouth or lips, or difficulty breathing.

## 2018-04-23 ENCOUNTER — Emergency Department (HOSPITAL_COMMUNITY)
Admission: EM | Admit: 2018-04-23 | Discharge: 2018-04-23 | Disposition: A | Discharge status: Home / Self Care | Payer: Self-pay | Attending: Emergency Medicine | Admitting: Emergency Medicine

## 2018-04-23 ENCOUNTER — Other Ambulatory Visit: Payer: Self-pay

## 2018-04-23 ENCOUNTER — Encounter (HOSPITAL_COMMUNITY): Payer: Self-pay | Admitting: Emergency Medicine

## 2018-04-23 DIAGNOSIS — S8011XA Contusion of right lower leg, initial encounter: Secondary | ICD-10-CM | POA: Insufficient documentation

## 2018-04-23 DIAGNOSIS — Y998 Other external cause status: Secondary | ICD-10-CM | POA: Insufficient documentation

## 2018-04-23 DIAGNOSIS — Z79899 Other long term (current) drug therapy: Secondary | ICD-10-CM | POA: Insufficient documentation

## 2018-04-23 DIAGNOSIS — Y92481 Parking lot as the place of occurrence of the external cause: Secondary | ICD-10-CM | POA: Insufficient documentation

## 2018-04-23 DIAGNOSIS — Y9389 Activity, other specified: Secondary | ICD-10-CM | POA: Insufficient documentation
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
## 2019-02-16 ENCOUNTER — Encounter (HOSPITAL_BASED_OUTPATIENT_CLINIC_OR_DEPARTMENT_OTHER): Payer: Self-pay | Admitting: *Deleted

## 2019-02-16 ENCOUNTER — Emergency Department (HOSPITAL_BASED_OUTPATIENT_CLINIC_OR_DEPARTMENT_OTHER)
Admission: EM | Admit: 2019-02-16 | Discharge: 2019-02-16 | Disposition: A | Discharge status: Home / Self Care | Payer: Self-pay | Attending: Emergency Medicine | Admitting: Emergency Medicine

## 2019-02-16 ENCOUNTER — Other Ambulatory Visit: Payer: Self-pay

## 2019-02-16 DIAGNOSIS — Z79899 Other long term (current) drug therapy: Secondary | ICD-10-CM | POA: Insufficient documentation

## 2019-02-16 DIAGNOSIS — R21 Rash and other nonspecific skin eruption: Secondary | ICD-10-CM | POA: Insufficient documentation

## 2019-02-16 MED ORDER — PERMETHRIN 5 % EX CREA: TOPICAL_CREAM | CUTANEOUS | 0 refills | Status: AC

## 2019-02-16 NOTE — ED Triage Notes (Signed)
Small circular lesions the size of mosquito bites over her body for 2 weeks. No one in the house has the same. Rash is itching.

## 2019-02-16 NOTE — ED Notes (Signed)
ED Provider at bedside. 

## 2019-02-16 NOTE — ED Provider Notes (Addendum)
[facility] EMERGENCY DEPARTMENT Provider Note   CSN: [id number] Arrival date & time: 02/16/19  1119     History   Chief Complaint Chief Complaint  Patient presents with  . Rash    HPI Molly Sawyer is a 27 y.o. female.     28yo F w/ PMH below who p/w rash.  Patient began having a rash approximately 2 weeks ago that started around her ankles and sock line and has now progressed to thighs, waist, and neck.  The rash is very itchy and she has not had any relief despite trying Benadryl cream, oral Benadryl.  No one in the house has a similar rash.  No new products or soaps.  No outdoor exposure.  She was initially concerned about the possibility of bedbugs and called Fredrik Rigger, who came out to the house and did not find any infestation on inspection. No recent illness.   The history is provided by the patient.  Rash   Past Medical History:  Diagnosis Date  . Pregnancy     Patient Active Problem List   Diagnosis Date Noted  . Impingement syndrome of left shoulder 11/07/2016  . Lower urinary tract infectious disease 12/17/2013  . GBS (group B Streptococcus carrier), +RV culture, currently pregnant 12/01/2013  . Threatened preterm labor 10/26/2013  . 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 Pregnancy, incidental 07/19/2013  . Pregnancy test positive 12/08/2012  . Absence of menstruation 12/08/2012    Past Surgical History:  Procedure Laterality Date  . CESAREAN SECTION       OB History    Gravida  2   Para      Term      Preterm      AB      Living        SAB      TAB      Ectopic      Multiple      Live Births               Home Medications    Prior to Admission medications   Medication Sig Start Date End Date Taking? Authorizing Provider  acetaminophen (TYLENOL) 500 MG tablet Take 1,000 mg by mouth every 6 (six) hours as  needed.   Yes [provider]  diphenhydrAMINE (BENADRYL) 25 MG tablet Take 1 tablet (25 mg total) by mouth every 6 (six) hours. 02/18/18  Yes Harlene Salts A, PA-C  Doxylamine-Pyridoxine 10-10 MG TBEC Take by mouth.    [provider]  famotidine (PEPCID) 20 MG tablet Take 1 tablet (20 mg total) by mouth 2 (two) times daily. 02/18/18   Harlene Salts A, PA-C  IRON PO Take by mouth.    [provider]  lidocaine (LIDODERM) 5 % Place 1 patch onto the skin daily. Remove & Discard patch within 12 hours or as directed by MD 10/28/16   Anselm Pancoast, PA-C  permethrin (ELIMITE) 5 % cream Apply to entire body avoiding eyes and mouth at night; wash off 8 hours later when you wake up 02/16/19   Little, Ambrose Finland, MD  Prenatal Vit-Fe Fumarate-FA (PRENATAL MULTIVITAMIN) TABS tablet Take 1 tablet by mouth daily at 12 noon.    [provider]  sertraline (ZOLOFT) 25 MG tablet Take 25 mg by mouth daily.    [provider]    Family History No family history on file.  Social  History Social History   Tobacco Use  . Smoking status: Never Smoker  . Smokeless tobacco: Never Used  Substance Use Topics  . Alcohol use: No  . Drug use: No     Allergies   Ibuprofen   Review of Systems Review of Systems  Skin: Positive for rash.   All other systems reviewed and are negative except that which was mentioned in HPI   Physical Exam Updated Vital Signs BP (!) 148/131 (BP Location: Right Arm)   Pulse 86   Temp 98.8 F (37.1 C) (Oral)   Resp 18   Ht 5\' 7"  (1.702 m)   Wt 86.2 kg   LMP 01/28/2019   SpO2 99%   BMI 29.76 kg/m   Physical Exam Vitals signs and nursing note reviewed.  Constitutional:      General: She is not in acute distress.    Appearance: She is well-developed.  HENT:     Head: Normocephalic and atraumatic.     Mouth/Throat:     Mouth: Mucous membranes are moist.     Pharynx: Oropharynx is clear.  Eyes:      Conjunctiva/sclera: Conjunctivae normal.  Neck:     Musculoskeletal: Neck supple.  Skin:    General: Skin is warm and dry.     Findings: Rash present.     Comments: Scattered macules, many with scabs from excoriations, on ankles/lower legs, upper back, inner thighs; no involvement of palms/soles or mucous membranes  Neurological:     Mental Status: She is alert and oriented to person, place, and time.  Psychiatric:        Judgment: Judgment normal.      ED Treatments / Results  Labs (all labs ordered are listed, but only abnormal results are displayed) Labs Reviewed - No data to display  EKG None  Radiology No results found.  Procedures Procedures (including critical care time)  Medications Ordered in ED Medications - No data to display   Initial Impression / Assessment and Plan / ED Course  I have reviewed the triage vital signs and the nursing notes.        Distribution and appearance of rash as well as the fact that it is pruritic suggests insect bites such as scabies or fleas rather than viral infection or allergic reaction. No vesicles, no mucous membrane involvement, no skin sloughing. Cautioned against scratching to avoid secondary bacterial infection. Recommended trial of permethrin cream and discussed washing all bedding and clothing.  Instructed to follow-up with PCP if symptoms do not improve after treatment.  Instructed to use hydrocortisone cream as needed for itching.  Final Clinical Impressions(s) / ED Diagnoses   Final diagnoses:  Rash    ED Discharge Orders         Ordered    permethrin (ELIMITE) 5 % cream     02/16/19 1234           Little, Wenda Overland, MD 02/16/19 1456    Little, Wenda Overland, MD 02/16/19 1456

## 2019-09-15 ENCOUNTER — Ambulatory Visit: Payer: Self-pay | Admitting: *Deleted

## 2019-09-15 NOTE — Telephone Encounter (Signed)
Pt reports "Cyst" left groin area, "Close to vagina." States onset Sunday. Reports size of golf ball, pinkish, 10/10 pain. "Cannot walk, sit." Also reports left leg "Feels weak." Denies fever.  Advised UC. States will follow disposition, care advise given per protocol.   Reason for Disposition . SEVERE pain (e.g., excruciating)  Answer Assessment - Initial Assessment Questions 1. APPEARANCE of BOIL: "What does the boil look like?"      Hard, fluid filled, pinkish 2. LOCATION: "Where is the boil located?"      Crease of left leg, between thigh and vagina, groin area 3. NUMBER: "How many boils are there?"      one 4. SIZE: "How big is the boil?" (e.g., inches, cm; compare to size of a coin or other object)     Golf ball 5. ONSET: "When did the boil start?"     Sunday 6. PAIN: "Is there any pain?" If so, ask: "How bad is the pain?"   (Scale 1-10; or mild, moderate, severe)     10 /10 7. FEVER: "Do you have a fever?" If so, ask: "What is it, how was it measured, and when did it start?"      no 8. SOURCE: "Have you been around anyone with boils or other Staph infections?" "Have you ever had boils before?"     No, 20 years ago, lanced boil. 9. OTHER SYMPTOMS: "Do you have any other symptoms?" (e.g., shaking chills, weakness, rash elsewhere on body)     Weakness left leg 10. PREGNANCY: "Is there any chance you are pregnant?" "When was your last menstrual period?"      No  Protocols used: BOIL (SKIN ABSCESS)-A-AH

## 2019-09-16 ENCOUNTER — Other Ambulatory Visit: Payer: Self-pay

## 2019-09-16 ENCOUNTER — Encounter (HOSPITAL_COMMUNITY): Payer: Self-pay

## 2019-09-16 ENCOUNTER — Emergency Department (HOSPITAL_COMMUNITY)
Admission: EM | Admit: 2019-09-16 | Discharge: 2019-09-16 | Disposition: A | Discharge status: Home / Self Care | Payer: Self-pay | Attending: Emergency Medicine | Admitting: Emergency Medicine

## 2019-09-16 DIAGNOSIS — L02214 Cutaneous abscess of groin: Secondary | ICD-10-CM | POA: Insufficient documentation

## 2019-09-16 DIAGNOSIS — L0291 Cutaneous abscess, unspecified: Secondary | ICD-10-CM

## 2019-09-16 DIAGNOSIS — Z79899 Other long term (current) drug therapy: Secondary | ICD-10-CM | POA: Insufficient documentation

## 2019-09-16 MED ORDER — LIDOCAINE HCL 2 % IJ SOLN
10.0000 mL | Freq: Once | INTRAMUSCULAR | Status: AC
  Administered 2019-09-16: 200 mg via INTRADERMAL
  Filled 2019-09-16: qty 20

## 2019-09-16 MED ORDER — DOXYCYCLINE HYCLATE 100 MG PO CAPS: 100.0000 mg | ORAL_CAPSULE | Freq: Two times a day (BID) | ORAL | 0 refills | Status: AC

## 2019-09-16 MED ORDER — LIDOCAINE-EPINEPHRINE-TETRACAINE (LET) TOPICAL GEL
3.0000 mL | Freq: Once | TOPICAL | Status: AC
  Administered 2019-09-16: 3 mL via TOPICAL
  Filled 2019-09-16: qty 3

## 2019-09-16 MED ORDER — ACETAMINOPHEN 500 MG PO TABS
1000.0000 mg | ORAL_TABLET | Freq: Once | ORAL | Status: AC
  Administered 2019-09-16: 1000 mg via ORAL
  Filled 2019-09-16: qty 2

## 2019-09-16 MED FILL — DOXYCYCLINE HYCLATE 100 MG: 100 | 7 days supply | Qty: 14 | Fill #0

## 2019-09-16 NOTE — ED Provider Notes (Signed)
[facility] EMERGENCY DEPARTMENT Provider Note   CSN: [id number] Arrival date & time: 09/16/19  1119     History Chief Complaint  Patient presents with  . Abscess    Molly Sawyer is a 29 y.o. female.  HPI   29 year old female presenting for evaluation of an abscess to the left groin area that has been present for the last 4 days.  Reports swelling, redness and pain to the area.  She is had no drainage from the wound.  She denies any fevers at home.  She denies any history of diabetes.  Pain is constant and severe in nature.  It is worse with palpation and movement.  Past Medical History:  Diagnosis Date  . Pregnancy     Patient Active Problem List   Diagnosis Date Noted  . Impingement syndrome of left shoulder 11/07/2016  . Lower urinary tract infectious disease 12/17/2013  . GBS (group B Streptococcus carrier), +RV culture, currently pregnant 12/01/2013  . Threatened preterm labor 10/26/2013  . 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 Pregnancy, incidental 07/19/2013  . Pregnancy test positive 12/08/2012  . Absence of menstruation 12/08/2012    Past Surgical History:  Procedure Laterality Date  . CESAREAN SECTION       OB History    Gravida  2   Para      Term      Preterm      AB      Living        SAB      TAB      Ectopic      Multiple      Live Births              No family history on file.  Social History   Tobacco Use  . Smoking status: Never Smoker  . Smokeless tobacco: Never Used  Substance Use Topics  . Alcohol use: No  . Drug use: No    Home Medications Prior to Admission medications   Medication Sig Start Date End Date Taking? Authorizing Provider  acetaminophen (TYLENOL) 500 MG tablet Take 1,000 mg by mouth every 6 (six) hours as needed.    [provider]  diphenhydrAMINE  (BENADRYL) 25 MG tablet Take 1 tablet (25 mg total) by mouth every 6 (six) hours. 02/18/18   Harlene Salts A, PA-C  doxycycline (VIBRAMYCIN) 100 MG capsule Take 1 capsule (100 mg total) by mouth 2 (two) times daily for 7 days. 09/16/19 09/23/19  Landrum Carbonell S, PA-C  Doxylamine-Pyridoxine 10-10 MG TBEC Take by mouth.    [provider]  famotidine (PEPCID) 20 MG tablet Take 1 tablet (20 mg total) by mouth 2 (two) times daily. 02/18/18   Harlene Salts A, PA-C  IRON PO Take by mouth.    [provider]  lidocaine (LIDODERM) 5 % Place 1 patch onto the skin daily. Remove & Discard patch within 12 hours or as directed by MD 10/28/16   Anselm Pancoast, PA-C  permethrin (ELIMITE) 5 % cream Apply to entire body avoiding eyes and mouth at night; wash off 8 hours later when you wake up 02/16/19   Little, Ambrose [place], MD  Prenatal Vit-Fe Fumarate-FA (PRENATAL MULTIVITAMIN) TABS tablet Take 1 tablet by mouth daily at 12 noon.    [provider]  sertraline (ZOLOFT) 25 MG tablet Take 25 mg by mouth daily.  [provider]    Allergies    Ibuprofen  Review of Systems   Review of Systems  Constitutional: Negative for fever.  Skin: Positive for color change.       abscess    Physical Exam Updated Vital Signs BP 130/84 (BP Location: Left Arm)   Pulse (!) 112   Temp 99.3 F (37.4 C) (Oral)   Resp 16   LMP 09/16/2019   SpO2 95%   Physical Exam Vitals and nursing note reviewed.  Constitutional:      General: She is not in acute distress.    Appearance: She is well-developed.  HENT:     Head: Normocephalic and atraumatic.  Eyes:     Conjunctiva/sclera: Conjunctivae normal.  Cardiovascular:     Rate and Rhythm: Normal rate.  Pulmonary:     Effort: Pulmonary effort is normal.  Genitourinary:    Comments: 1x3cm area of fluctuance and erythema just lateral to the left labia. No crepitus or involvement of the labia. Musculoskeletal:        General: Normal  range of motion.     Cervical back: Neck supple.  Skin:    General: Skin is warm and dry.  Neurological:     Mental Status: She is alert.     ED Results / Procedures / Treatments   Labs (all labs ordered are listed, but only abnormal results are displayed) Labs Reviewed - No data to display  EKG None  Radiology No results found.  Procedures .Marland KitchenIncision and Drainage  Date/Time: 09/16/2019 2:35 PM Performed by: Rodney Booze, PA-C Authorized by: Rodney Booze, PA-C   Consent:    Consent obtained:  Verbal   Consent given by:  Patient   Risks discussed:  Bleeding, incomplete drainage and pain   Alternatives discussed:  No treatment Universal protocol:    Procedure explained and questions answered to patient or proxy's satisfaction: yes     Relevant documents present and verified: yes     Test results available and properly labeled: yes     Imaging studies available: yes     Required blood products, implants, devices, and special equipment available: yes     Site/side marked: yes     Immediately prior to procedure a time out was called: yes     Patient identity confirmed:  Verbally with patient Location:    Type:  Abscess   Location:  Anogenital   Anogenital location: lateral to the left vulva. Pre-procedure details:    Skin preparation:  Betadine Anesthesia (see MAR for exact dosages):    Anesthesia method:  Local infiltration   Local anesthetic:  Lidocaine 2% WITH epi Procedure type:    Complexity:  Complex Procedure details:    Incision types:  Single straight   Incision depth:  Subcutaneous   Scalpel blade:  11   Drainage:  Purulent   Drainage amount:  Copious Post-procedure details:    Patient tolerance of procedure:  Tolerated well, no immediate complications   (including critical care time)  Medications Ordered in ED Medications  lidocaine-EPINEPHrine-tetracaine (LET) topical gel (3 mLs Topical Given 09/16/19 1322)  acetaminophen (TYLENOL) tablet  1,000 mg (1,000 mg Oral Given 09/16/19 1322)  lidocaine (XYLOCAINE) 2 % (with pres) injection 200 mg (200 mg Intradermal Given 09/16/19 1323)    ED Course  I have reviewed the triage vital signs and the nursing notes.  Pertinent labs & imaging results that were available during my care of the patient were reviewed by me  and considered in my medical decision making (see chart for details).    MDM Rules/Calculators/A&P                      Patient with skin abscess amenable to incision and drainage.  Abscess was not large enough to warrant packing or drain,  wound recheck in 2 days. Encouraged home warm soaks and flushing.  Mild signs of cellulitis is surrounding skin.  Will d/c to home with abx. Advised on return precautions and f/u. She voices understanding and is in agreement with plan. All questions answered, pt stable for d/c.  Final Clinical Impression(s) / ED Diagnoses Final diagnoses:  Abscess    Rx / DC Orders ED Discharge Orders         Ordered    doxycycline (VIBRAMYCIN) 100 MG capsule  2 times daily     09/16/19 [address] Maciel Kegg S, PA-C 09/16/19 1436    Benjiman Core, MD 09/17/19 0710

## 2019-09-16 NOTE — Discharge Instructions (Addendum)
You were given a prescription for antibiotics. Please take the antibiotic prescription fully.   Please follow up with your primary care provider within 5-7 days for re-evaluation of your symptoms. If you do not have a primary care provider, information for a healthcare clinic has been provided for you to make arrangements for follow up care.  Please return to the emergency room immediately if you experience any new or worsening symptoms or any symptoms that indicate worsening infection such as fevers, increased redness/swelling/pain, warmth, or drainage from the affected area.    

## 2019-09-16 NOTE — ED Triage Notes (Signed)
Pt reports abscess to left groin since Sunday, no drainage at this time

## 2019-10-28 IMAGING — CR DG CHEST 2V
2 series · 2 of 2 positions shown · non-contrast
Comparison: Chest radiographs 05/31/2014.

CLINICAL DATA: 27-year-old female with facial swelling upon waking
today. URI symptoms, recently took Zyrtec and Mucinex.

EXAM:
CHEST - 2 VIEW

[w chest pa]
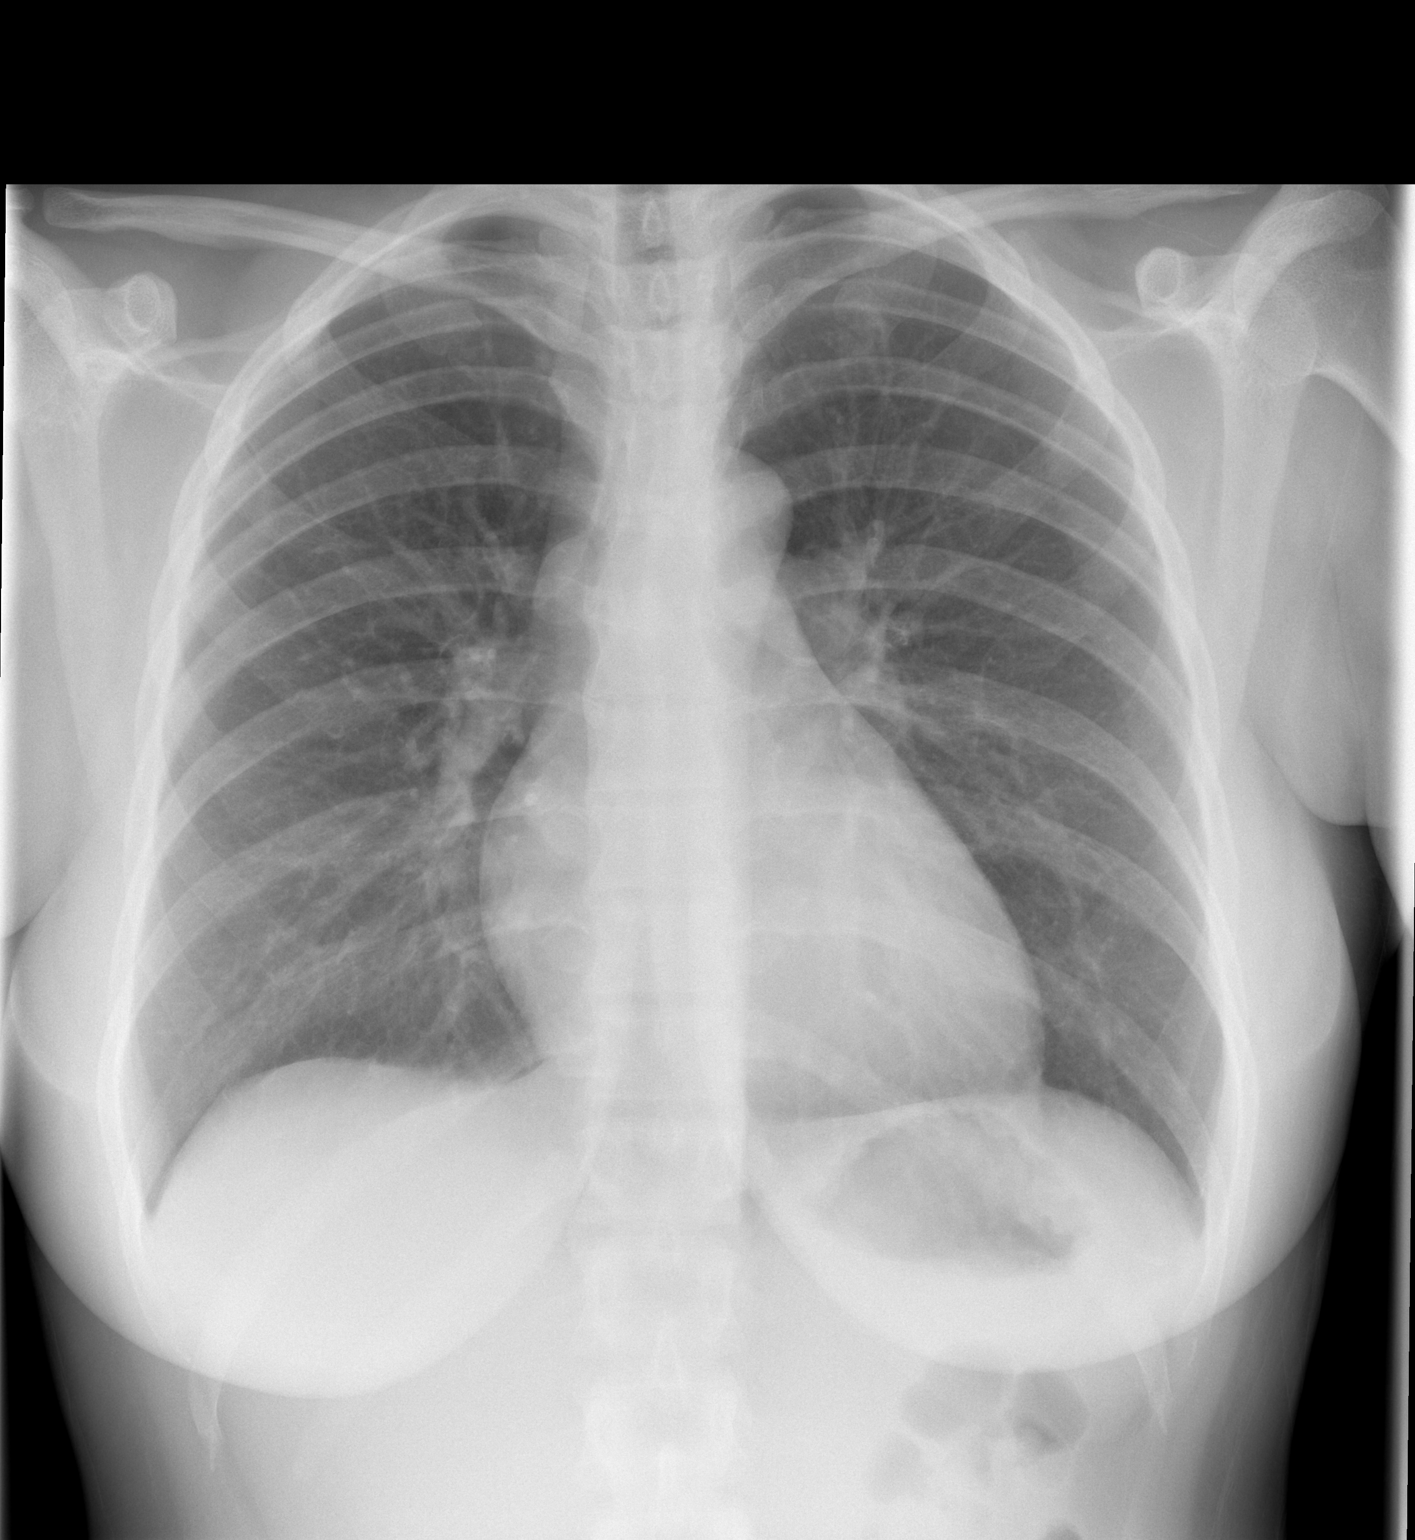

[w chest lat]
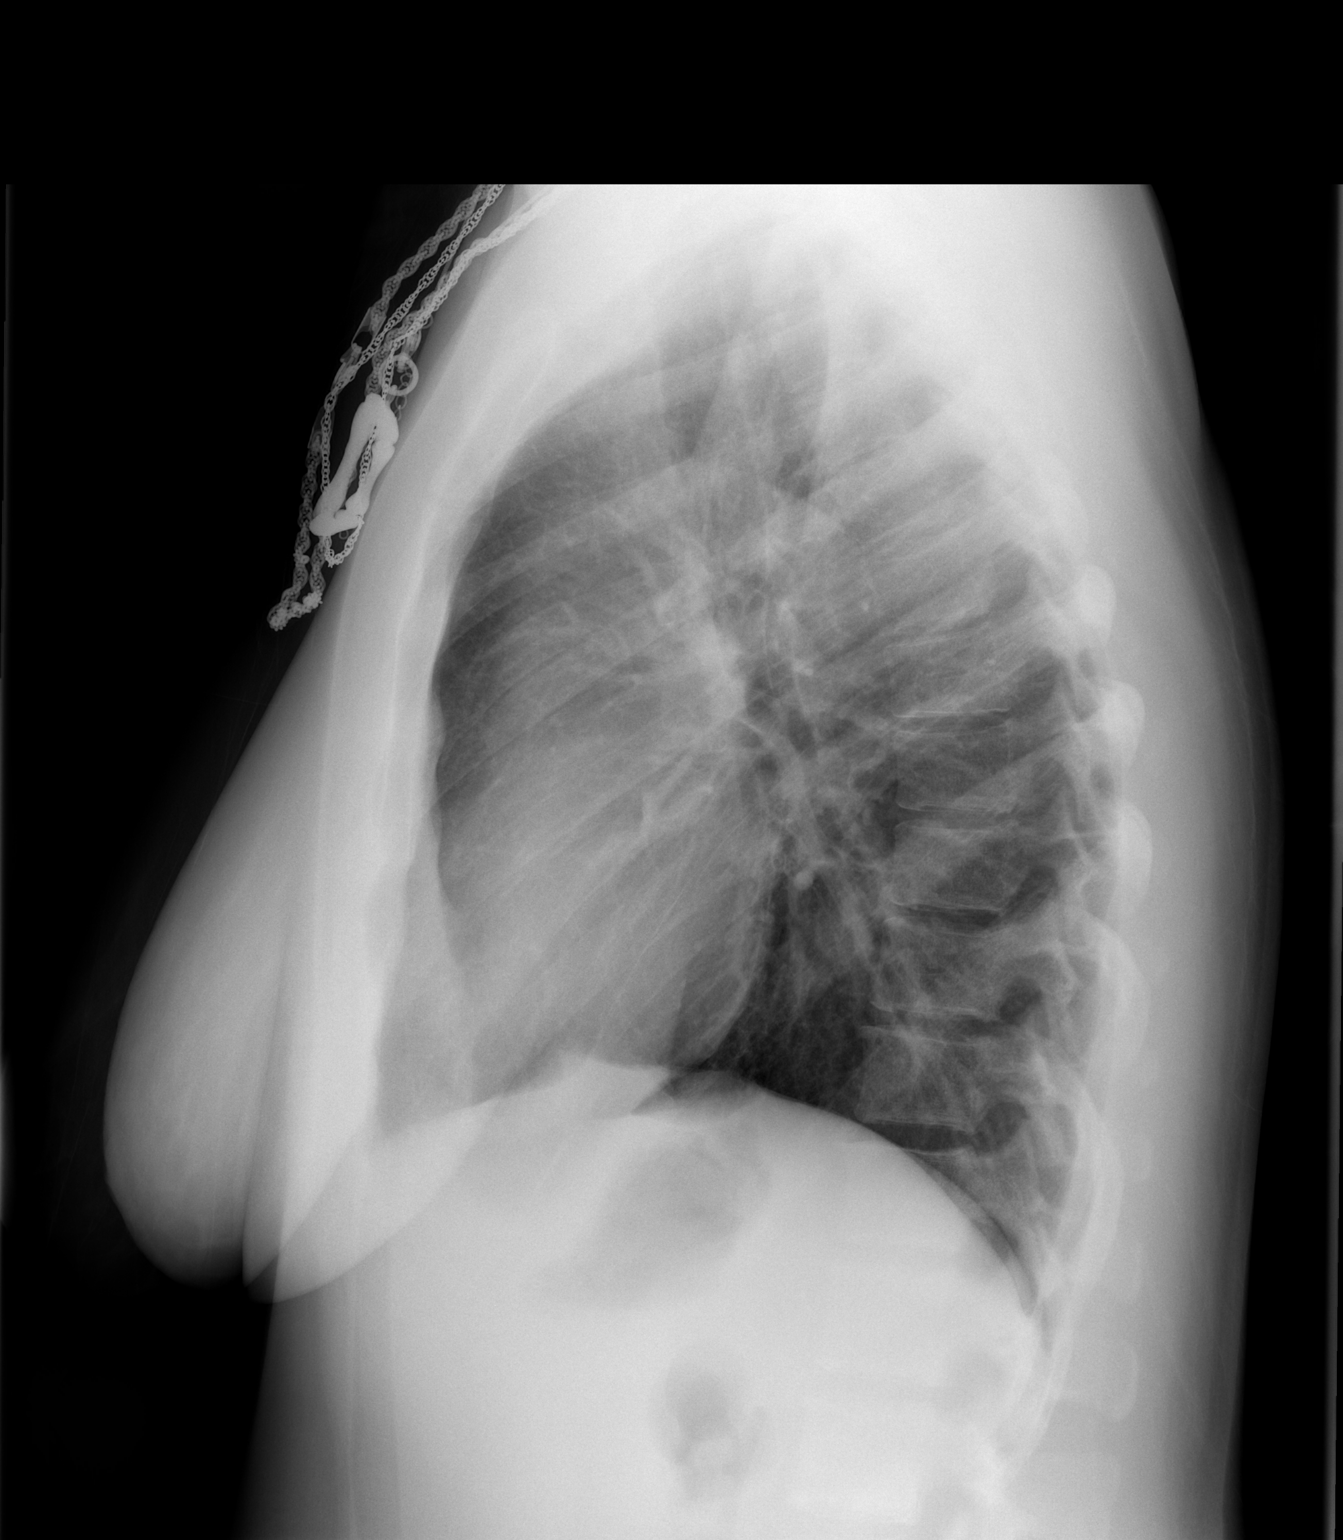

[2 of 2 positions shown; findings below may reference images not displayed]

FINDINGS: Normal lung volumes. Visualized tracheal air column is within normal
limits. The heart size and mediastinal contours are within normal
limits. Both lungs are clear. Negative visible bowel gas pattern. No
osseous abnormality identified.
IMPRESSION: Negative.  No cardiopulmonary abnormality.

## 2020-08-08 ENCOUNTER — Encounter (HOSPITAL_COMMUNITY): Payer: Self-pay | Admitting: Emergency Medicine

## 2020-08-08 ENCOUNTER — Ambulatory Visit (HOSPITAL_COMMUNITY)
Admission: EM | Admit: 2020-08-08 | Discharge: 2020-08-08 | Disposition: A | Discharge status: Home / Self Care | Payer: Self-pay

## 2020-08-08 ENCOUNTER — Other Ambulatory Visit: Payer: Self-pay

## 2020-08-08 DIAGNOSIS — M545 Low back pain, unspecified: Secondary | ICD-10-CM | POA: Diagnosis present

## 2020-08-08 DIAGNOSIS — M62830 Muscle spasm of back: Secondary | ICD-10-CM | POA: Diagnosis present

## 2020-08-08 DIAGNOSIS — Z041 Encounter for examination and observation following transport accident: Secondary | ICD-10-CM

## 2020-08-08 NOTE — Discharge Instructions (Signed)
Rest, push fluids.  Take over-the-counter Tylenol as labeled directed.  May also try topical lidocaine patches.  Apply ice to back 20 minutes 3 times a day.  Follow-up with your PCP.  You will be sore for several days.
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
## 2020-10-04 ENCOUNTER — Emergency Department (HOSPITAL_COMMUNITY)
Admission: EM | Admit: 2020-10-04 | Discharge: 2020-10-04 | Disposition: A | Discharge status: Home / Self Care | Payer: Self-pay | Attending: Emergency Medicine | Admitting: Emergency Medicine

## 2020-10-04 ENCOUNTER — Encounter (HOSPITAL_COMMUNITY): Payer: Self-pay | Admitting: *Deleted

## 2020-10-04 ENCOUNTER — Other Ambulatory Visit: Payer: Self-pay

## 2020-10-04 DIAGNOSIS — Z5321 Procedure and treatment not carried out due to patient leaving prior to being seen by health care provider: Secondary | ICD-10-CM | POA: Insufficient documentation

## 2020-10-04 DIAGNOSIS — N644 Mastodynia: Secondary | ICD-10-CM | POA: Insufficient documentation

## 2020-10-04 NOTE — ED Notes (Signed)
Pt not responding for vital recheck. 

## 2020-10-04 NOTE — ED Triage Notes (Signed)
Pt reports onset of right breast pain yesterday. Has redness, possible abscess to breast. Denies fever.

## 2020-10-04 NOTE — ED Notes (Signed)
Called pt 3x no response °

## 2020-12-04 ENCOUNTER — Encounter (HOSPITAL_COMMUNITY): Payer: Self-pay

## 2020-12-04 ENCOUNTER — Emergency Department (HOSPITAL_COMMUNITY): Payer: Self-pay

## 2020-12-04 ENCOUNTER — Other Ambulatory Visit: Payer: Self-pay

## 2020-12-04 ENCOUNTER — Emergency Department (HOSPITAL_COMMUNITY)
Admission: EM | Admit: 2020-12-04 | Discharge: 2020-12-04 | Disposition: A | Discharge status: Home / Self Care | Payer: Self-pay | Attending: Emergency Medicine | Admitting: Emergency Medicine

## 2020-12-04 DIAGNOSIS — M62838 Other muscle spasm: Secondary | ICD-10-CM | POA: Insufficient documentation

## 2020-12-04 DIAGNOSIS — M542 Cervicalgia: Secondary | ICD-10-CM | POA: Insufficient documentation

## 2020-12-04 DIAGNOSIS — M25512 Pain in left shoulder: Secondary | ICD-10-CM | POA: Insufficient documentation

## 2020-12-04 MED ORDER — VALACYCLOVIR HCL 1 G PO TABS: 1000.0000 mg | ORAL_TABLET | Freq: Three times a day (TID) | ORAL | 0 refills | Status: AC

## 2020-12-04 MED ORDER — CYCLOBENZAPRINE HCL 10 MG PO TABS: 10.0000 mg | ORAL_TABLET | Freq: Two times a day (BID) | ORAL | 0 refills | Status: AC | PRN

## 2020-12-04 MED ORDER — LIDOCAINE 5 % EX PTCH: 1.0000 | MEDICATED_PATCH | CUTANEOUS | 0 refills | Status: AC
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
## 2020-12-04 NOTE — ED Notes (Signed)
Patient transported to X-ray 

## 2020-12-04 NOTE — ED Triage Notes (Signed)
Pt came POV from home c/o left shoulder pain. Pt states the pain started about 4 days ago. Pt denies any injury or trauma to the shoulder. Pt states she just woke up and it was hurting. Pt states the pain has gotten worse over the last few days. Pt states she cannot lay down and has to sit with perfect posture to keep from hurting. Pt has tried pain patches and tylenol with no relief. Pt states pain is 10/10.

## 2020-12-04 NOTE — Discharge Instructions (Addendum)
Your history, exam, work-up today are consistent with muscle spasms and musculoskeletal discomfort causing your presenting problem.  The x-rays were reassuring and your EKG was also reassuring.  Please take the muscle relaxant to help with the spasms and use the Lidoderm patches to more focally help with the discomfort.  Please follow-up with sports medicine or orthopedics, included both numbers.  As we discussed, there is a chance this could be the first presentation of shingles so if you develop a vesicular rash all over the shoulder and armpit where you are hurting, please start taking this medication.  Please follow-up with a primary doctor.  If any symptoms change or worsen acutely, please return to the nearest emergency department.

## 2021-12-17 ENCOUNTER — Emergency Department (HOSPITAL_COMMUNITY)
Admission: EM | Admit: 2021-12-17 | Discharge: 2021-12-17 | Disposition: A | Discharge status: Home / Self Care | Payer: Self-pay | Attending: Student | Admitting: Student

## 2021-12-17 DIAGNOSIS — N611 Abscess of the breast and nipple: Secondary | ICD-10-CM | POA: Insufficient documentation

## 2021-12-17 MED ORDER — DOXYCYCLINE HYCLATE 100 MG PO CAPS: 100.0000 mg | ORAL_CAPSULE | Freq: Two times a day (BID) | ORAL | 0 refills | Status: AC

## 2021-12-17 NOTE — ED Triage Notes (Signed)
Pt c/o abscess/ cyst on R breast, "leaking pus a little bit." First noticed spot 57yr ago, states it started to get worse 2wks ago, draining began today. No fevers, no meds PTA

## 2021-12-17 NOTE — ED Provider Notes (Signed)
[facility] EMERGENCY DEPARTMENT Provider Note   CSN: [id number] Arrival date & time: 12/17/21  1821     History  Chief Complaint  Patient presents with   Abscess    Molly Sawyer is a 31 y.o. female.   Abscess    Patient presents due to skin infection to right breast.  Its been like that for about a years a small pimple, started draining today.  She not having any fevers at home, no recent antibiotic use.  Patient is not breast-feeding.  Denies any history of breast cancer, not on any imaging of the breast previously.  Home Medications Prior to Admission medications   Medication Sig Start Date End Date Taking? Authorizing Provider  doxycycline (VIBRAMYCIN) 100 MG capsule Take 1 capsule (100 mg total) by mouth 2 (two) times daily. 12/17/21  Yes Theron Arista, PA-C  acetaminophen (TYLENOL) 650 MG CR tablet Take 1,300-1,950 mg by mouth every 8 (eight) hours as needed for pain.    [provider]  cyclobenzaprine (FLEXERIL) 10 MG tablet Take 1 tablet (10 mg total) by mouth 2 (two) times daily as needed for muscle spasms. 12/04/20   Tegeler, Canary Brim, MD  diphenhydrAMINE (BENADRYL) 25 MG tablet Take 1 tablet (25 mg total) by mouth every 6 (six) hours. Patient not taking: No sig reported 02/18/18   Harlene Salts A, PA-C  famotidine (PEPCID) 20 MG tablet Take 1 tablet (20 mg total) by mouth 2 (two) times daily. Patient not taking: No sig reported 02/18/18   Harlene Salts A, PA-C  lidocaine (LIDODERM) 5 % Place 1 patch onto the skin daily. Remove & Discard patch within 12 hours or as directed by MD Patient not taking: No sig reported 10/28/16   Joy, Shawn C, PA-C  lidocaine (LIDODERM) 5 % Place 1 patch onto the skin daily. Remove & Discard patch within 12 hours or as directed by MD 12/04/20   Tegeler, Canary Brim, MD  permethrin (ELIMITE) 5 % cream Apply to entire body avoiding eyes and mouth at night; wash off 8 hours later when you wake up Patient not  taking: No sig reported 02/16/19   Little, Ambrose Finland, MD      Allergies    Nsaids and Ibuprofen    Review of Systems   Review of Systems  Physical Exam Updated Vital Signs BP 124/72 (BP Location: Right Arm)   Pulse 93   Temp 98.7 F (37.1 C) (Oral)   Resp 16   SpO2 97%  Physical Exam Vitals and nursing note reviewed. Exam conducted with a chaperone present.  Constitutional:      Appearance: Normal appearance.  HENT:     Head: Normocephalic and atraumatic.  Eyes:     General: No scleral icterus.       Right eye: No discharge.        Left eye: No discharge.     Extraocular Movements: Extraocular movements intact.     Pupils: Pupils are equal, round, and reactive to light.  Cardiovascular:     Rate and Rhythm: Normal rate and regular rhythm.     Pulses: Normal pulses.     Heart sounds: Normal heart sounds. No murmur heard.    No friction rub. No gallop.  Pulmonary:     Effort: Pulmonary effort is normal. No respiratory distress.     Breath sounds: Normal breath sounds.  Abdominal:     General: Abdomen is flat. Bowel sounds are normal. There is no distension.  Palpations: Abdomen is soft.     Tenderness: There is no abdominal tenderness.  Skin:    General: Skin is warm and dry.     Coloration: Skin is not jaundiced.     Comments: Skin exam performed with female chaperone in room.  Patient has 3 cm area of fluctuance with actively draining purulent material.  Mild surrounding erythema maybe 2 cm.  Tender to palpation, no nipple involvement.  Neurological:     Mental Status: She is alert. Mental status is at baseline.     Coordination: Coordination normal.     ED Results / Procedures / Treatments   Labs (all labs ordered are listed, but only abnormal results are displayed) Labs Reviewed - No data to display  EKG None  Radiology No results found.  Procedures Procedures    Medications Ordered in ED Medications - No data to display  ED Course/ Medical  Decision Making/ A&P                           Medical Decision Making Risk Prescription drug management.   Patient presents due to skin infection to right breast.  Differential includes not limited to mastitis, breast abscess, metastatic process, cellulitis.  On exam patient has an abscess with obvious purulent drainage.  Given it is actively draining I do not think I&D indicated especially given the location.  Warm compresses advised, started patient on antibiotics.  We discussed return precautions as well as wound care.  Vital signs are very stable, do not think this is SIRS.  Given not breast-feeding not mastitis.  Additionally I think likeliness of metastatic process very unlikely.        Final Clinical Impression(s) / ED Diagnoses Final diagnoses:  Breast abscess    Rx / DC Orders ED Discharge Orders          Ordered    doxycycline (VIBRAMYCIN) 100 MG capsule  2 times daily        12/17/21 1835              Theron Arista, PA-C 12/17/21 Threasa Beards, MD 12/17/21 2041

## 2021-12-17 NOTE — Discharge Instructions (Addendum)
You were seen today in the emergency department for a breast abscess.  Take the doxycycline twice daily.  Do warm compresses to help get the purulent material out.  Soak in warm water, can also use a hot rag.  Follow-up with your primary this week for reevaluation.  Return to ED for new concerning symptoms, fever.

## 2022-03-27 ENCOUNTER — Other Ambulatory Visit: Payer: Self-pay

## 2022-03-27 ENCOUNTER — Emergency Department (HOSPITAL_BASED_OUTPATIENT_CLINIC_OR_DEPARTMENT_OTHER)
Admission: EM | Admit: 2022-03-27 | Discharge: 2022-03-27 | Disposition: A | Discharge status: Home / Self Care | Payer: Self-pay | Attending: Emergency Medicine | Admitting: Emergency Medicine

## 2022-03-27 ENCOUNTER — Encounter (HOSPITAL_BASED_OUTPATIENT_CLINIC_OR_DEPARTMENT_OTHER): Payer: Self-pay

## 2022-03-27 DIAGNOSIS — K0889 Other specified disorders of teeth and supporting structures: Secondary | ICD-10-CM | POA: Insufficient documentation

## 2022-03-27 MED ORDER — PENICILLIN V POTASSIUM 500 MG PO TABS: 500.0000 mg | ORAL_TABLET | Freq: Four times a day (QID) | ORAL | 0 refills | Status: AC

## 2022-03-27 MED ORDER — BENZOCAINE 10 % MT GEL: 1.0000 | Freq: Four times a day (QID) | OROMUCOSAL | 0 refills | Status: AC | PRN

## 2022-03-27 MED ORDER — ACETAMINOPHEN 325 MG PO TABS
650.0000 mg | ORAL_TABLET | Freq: Once | ORAL | Status: AC
  Administered 2022-03-27: 650 mg via ORAL
  Filled 2022-03-27: qty 2

## 2022-03-27 NOTE — ED Provider Notes (Signed)
[facility] EMERGENCY DEPARTMENT Provider Note   CSN: [id number] Arrival date & time: 03/27/22  1644     History  Chief Complaint  Patient presents with   Dental Pain    Molly Sawyer is a 31 y.o. female with no significant past medical history who presents to the ED due to left lower dental pain x 2 days.  Pain located in left posterior lower molar.  Patient has a dentist appointment beginning of the new year.  Admits to some left-sided facial edema.  No trismus.  Denies changes to phonation.  Denies fever and chills.  No difficulty swallowing or breathing.  History obtained from patient and past medical records. No interpreter used during encounter.       Home Medications Prior to Admission medications   Medication Sig Start Date End Date Taking? Authorizing Provider  acetaminophen (TYLENOL) 650 MG CR tablet Take 1,300-1,950 mg by mouth every 8 (eight) hours as needed for pain.    [provider]  cyclobenzaprine (FLEXERIL) 10 MG tablet Take 1 tablet (10 mg total) by mouth 2 (two) times daily as needed for muscle spasms. 12/04/20   Tegeler, Canary Brim, MD  diphenhydrAMINE (BENADRYL) 25 MG tablet Take 1 tablet (25 mg total) by mouth every 6 (six) hours. Patient not taking: No sig reported 02/18/18   Harlene Salts A, PA-C  doxycycline (VIBRAMYCIN) 100 MG capsule Take 1 capsule (100 mg total) by mouth 2 (two) times daily. 12/17/21   Theron Arista, PA-C  famotidine (PEPCID) 20 MG tablet Take 1 tablet (20 mg total) by mouth 2 (two) times daily. Patient not taking: No sig reported 02/18/18   Harlene Salts A, PA-C  lidocaine (LIDODERM) 5 % Place 1 patch onto the skin daily. Remove & Discard patch within 12 hours or as directed by MD Patient not taking: No sig reported 10/28/16   Joy, Shawn C, PA-C  lidocaine (LIDODERM) 5 % Place 1 patch onto the skin daily. Remove & Discard patch within 12 hours or as directed by MD 12/04/20   Tegeler, Canary Brim, MD  permethrin  (ELIMITE) 5 % cream Apply to entire body avoiding eyes and mouth at night; wash off 8 hours later when you wake up Patient not taking: No sig reported 02/16/19   Little, Ambrose Finland, MD      Allergies    Nsaids and Ibuprofen    Review of Systems   Review of Systems  Constitutional:  Negative for chills and fever.  HENT:  Positive for dental problem. Negative for trouble swallowing and voice change.   Respiratory:  Negative for shortness of breath.   All other systems reviewed and are negative.   Physical Exam Updated Vital Signs BP 124/86 (BP Location: Right Arm)   Pulse 73   Temp 98.5 F (36.9 C) (Oral)   Resp 16   Ht 5\' 7"  (1.702 m)   Wt 95.3 kg   LMP 03/07/2022   SpO2 95%   BMI 32.89 kg/m  Physical Exam Vitals and nursing note reviewed.  Constitutional:      General: She is not in acute distress.    Appearance: She is not ill-appearing.  HENT:     Head: Normocephalic.     Mouth/Throat:     Comments: Good dentition throughout.  Left lower wisdom tooth erupting from gumline with tenderness.  No abscess.  Tongue in normal position without protrusion.  No trismus.  Normal phonation. No facial edema.  Eyes:     Pupils: Pupils  are equal, round, and reactive to light.  Cardiovascular:     Rate and Rhythm: Normal rate and regular rhythm.     Pulses: Normal pulses.     Heart sounds: Normal heart sounds. No murmur heard.    No friction rub. No gallop.  Pulmonary:     Effort: Pulmonary effort is normal.     Breath sounds: Normal breath sounds.  Abdominal:     General: Abdomen is flat. There is no distension.     Palpations: Abdomen is soft.     Tenderness: There is no abdominal tenderness. There is no guarding or rebound.  Musculoskeletal:        General: Normal range of motion.     Cervical back: Neck supple.  Skin:    General: Skin is warm and dry.  Neurological:     General: No focal deficit present.     Mental Status: She is alert.  Psychiatric:        Mood  and Affect: Mood normal.        Behavior: Behavior normal.     ED Results / Procedures / Treatments   Labs (all labs ordered are listed, but only abnormal results are displayed) Labs Reviewed - No data to display  EKG None  Radiology No results found.  Procedures Procedures    Medications Ordered in ED Medications  acetaminophen (TYLENOL) tablet 650 mg (has no administration in time range)    ED Course/ Medical Decision Making/ A&P                           Medical Decision Making Risk OTC drugs.   31 year old female presents to the ED due to left lower dental pain x 2 days.  Has a dental appointment the beginning of the new year.  No fever or chills.  Denies trismus or changes to phonation.  Upon arrival, vitals all within normal limits.  Patient in no acute distress.  Left lower wisdom tooh erupting from gumline with tenderness surrounding it.  No abscess.  Tongue in normal position without protrusion.  Airway patent. Low suspicion for Ludwigs or deep space infection.  Discussed with patient need to have wisdom tooth removed.  Will discharge with antibiotics and benzocaine gel.  Patient allergic to NSAIDs so advised patient take Tylenol as needed for pain.  Dental resources given to patient at discharge. Strict ED precautions discussed with patient. Patient states understanding and agrees to plan. Patient discharged home in no acute distress and stable vitals.  No PCP       Final Clinical Impression(s) / ED Diagnoses Final diagnoses:  None    Rx / DC Orders ED Discharge Orders     None         Karie Kirks 03/27/22 1957    Tegeler, Gwenyth Allegra, MD 03/27/22 [phone]

## 2022-03-27 NOTE — Discharge Instructions (Addendum)
It was a pleasure taking care of you today.  As discussed, I have included all of the dental resources in the community.  Please call tomorrow to schedule an appointment for further evaluation.  Continue to take Tylenol as needed for dental pain.  I am sending you home with antibiotics and numbing gel.  Return to the ER for new or worsening symptoms.

## 2022-03-27 NOTE — ED Triage Notes (Signed)
Pt c/o dental swelling to the L lower area x 2 days. Pt states she is unable to get a dental appointment for over a month from now.

## 2022-03-27 NOTE — ED Notes (Signed)
Dc instructions and scripts reviewed with pt. Pt to follow up with dentist. No questions or concerns at this time.

## 2022-08-13 IMAGING — DX DG SHOULDER 2+V*L*
3 series · 3 of 3 positions shown · non-contrast
Comparison: None.

CLINICAL DATA: Left shoulder pain.  Left upper back pain.

EXAM:
LEFT SHOULDER - 2+ VIEW

[w shoulder internal left]
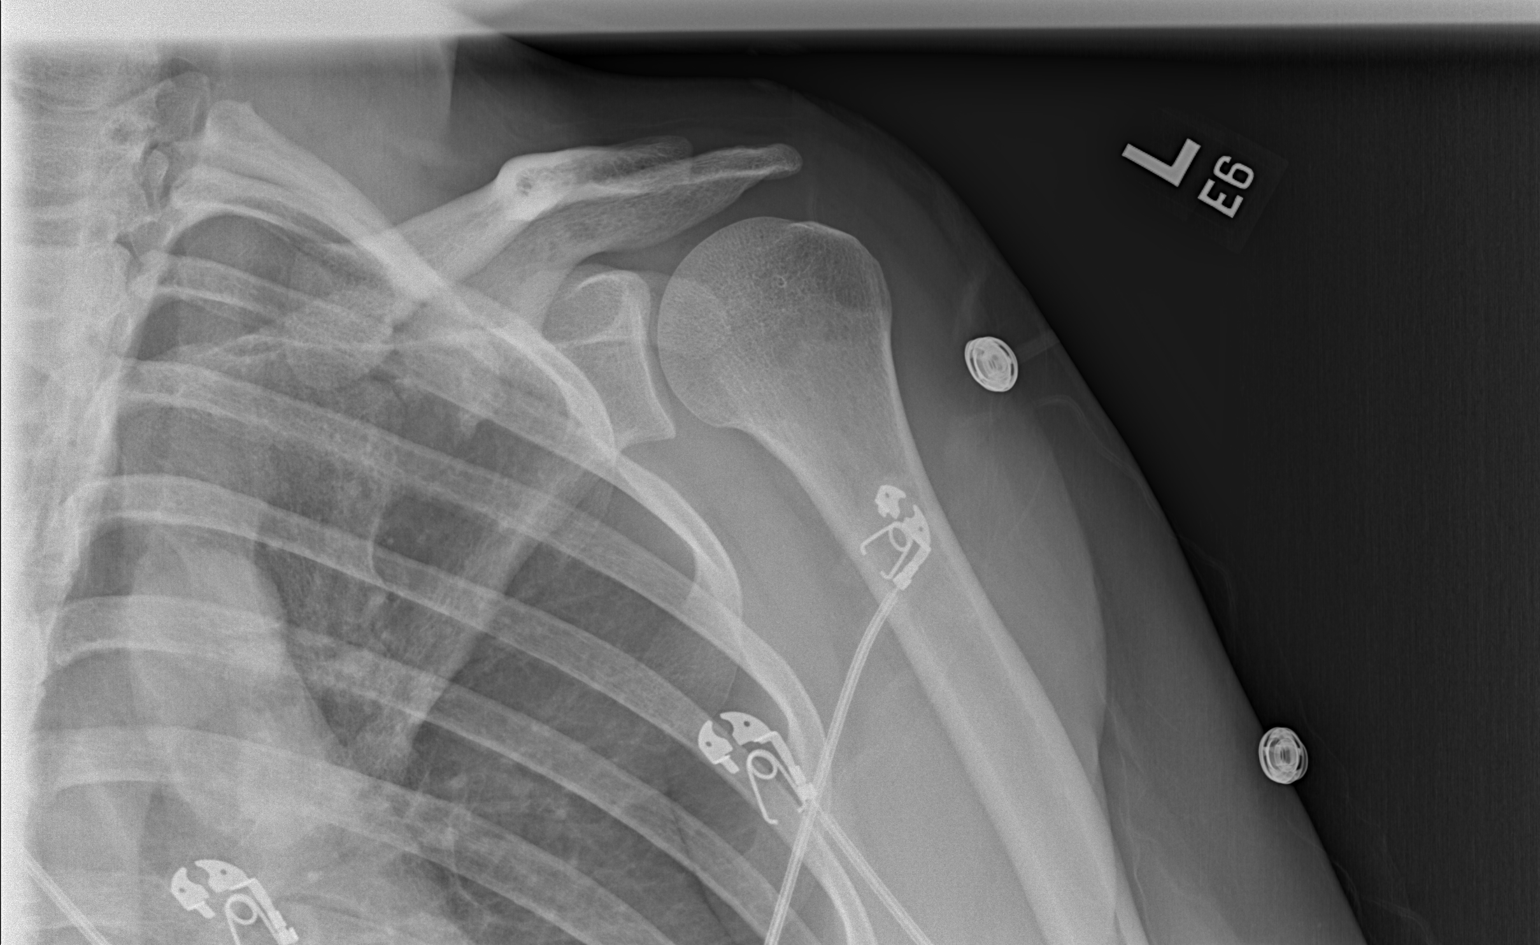

[w shoulder y-view left]
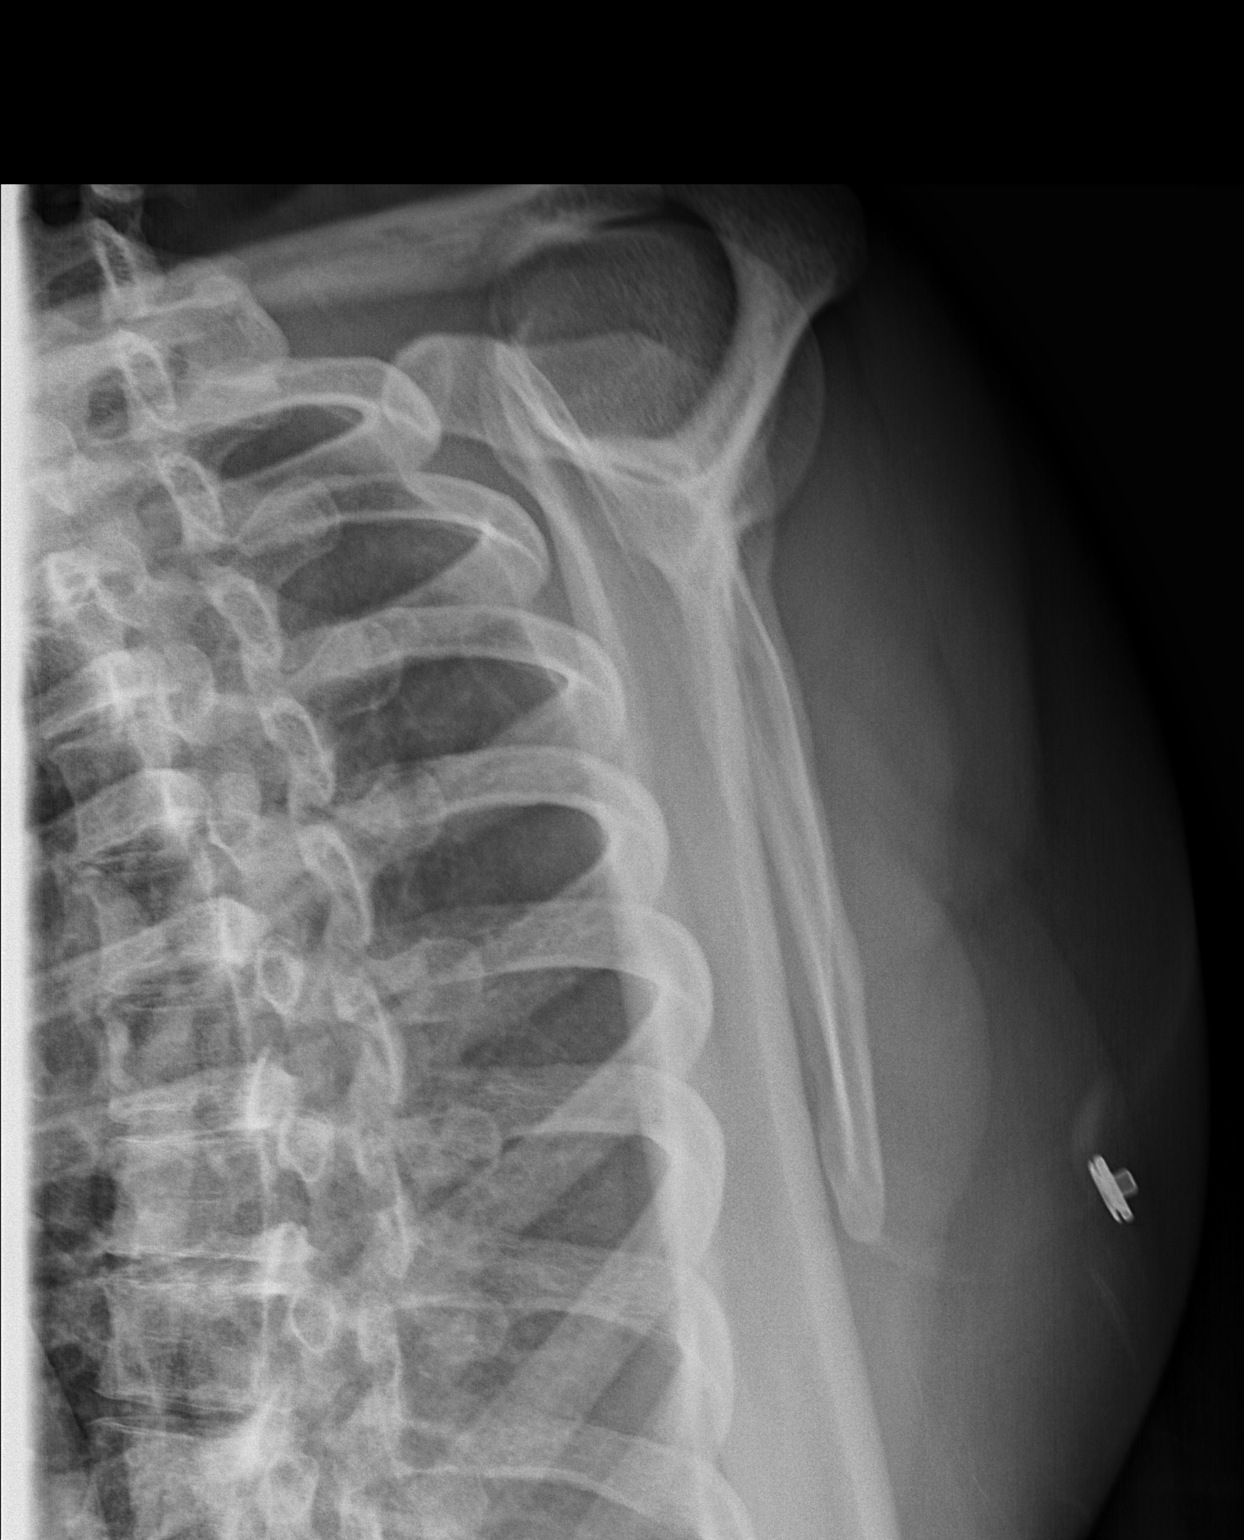

[x shoulder axillary left]
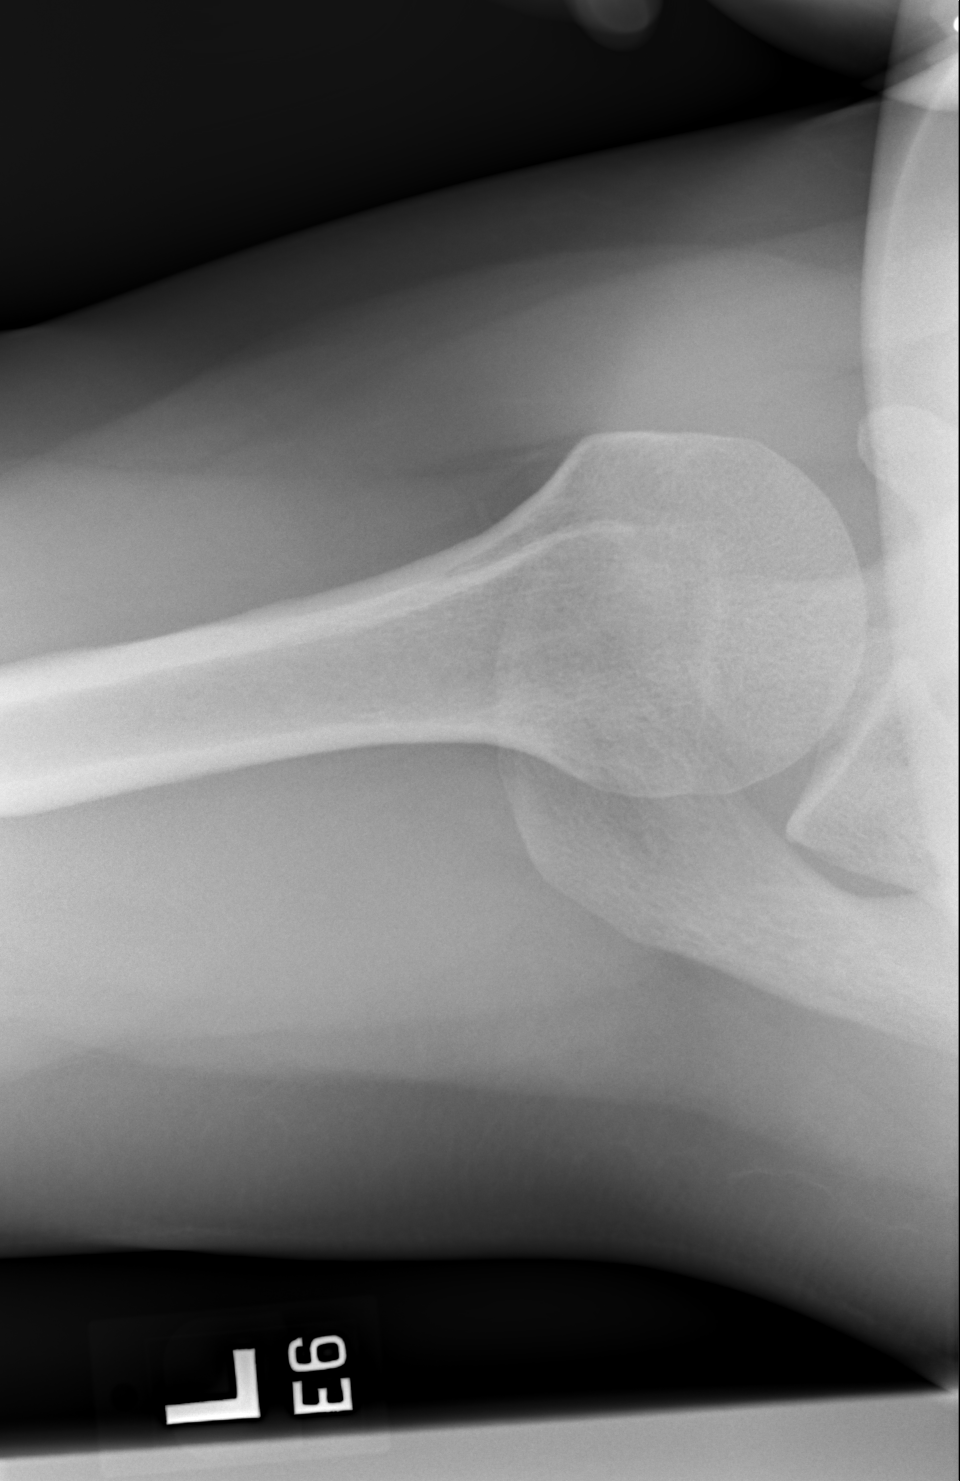

[3 of 3 positions shown; findings below may reference images not displayed]

FINDINGS: There is no evidence of fracture or dislocation. There is no
evidence of arthropathy or other focal bone abnormality. Soft
tissues are unremarkable.
IMPRESSION: Negative.

## 2023-11-20 ENCOUNTER — Other Ambulatory Visit: Payer: Self-pay

## 2023-11-20 ENCOUNTER — Emergency Department (HOSPITAL_BASED_OUTPATIENT_CLINIC_OR_DEPARTMENT_OTHER)
Admission: EM | Admit: 2023-11-20 | Discharge: 2023-11-20 | Disposition: A | Discharge status: Home / Self Care | Payer: Self-pay

## 2023-11-20 DIAGNOSIS — K047 Periapical abscess without sinus: Secondary | ICD-10-CM | POA: Insufficient documentation

## 2023-11-20 DIAGNOSIS — K0889 Other specified disorders of teeth and supporting structures: Secondary | ICD-10-CM | POA: Diagnosis present

## 2023-11-20 MED ORDER — PENICILLIN V POTASSIUM 500 MG PO TABS
500.0000 mg | ORAL_TABLET | Freq: Four times a day (QID) | ORAL | 0 refills | Status: AC
Start: 2023-11-20 — End: 2023-11-27

## 2023-11-20 MED ORDER — OXYCODONE-ACETAMINOPHEN 5-325 MG PO TABS: 1.0000 | ORAL_TABLET | Freq: Four times a day (QID) | ORAL | 0 refills | Status: AC | PRN

## 2023-11-20 MED ORDER — PENICILLIN V POTASSIUM 250 MG PO TABS
500.0000 mg | ORAL_TABLET | Freq: Once | ORAL | Status: AC
  Administered 2023-11-20: 500 mg via ORAL
  Filled 2023-11-20: qty 2

## 2023-11-20 NOTE — ED Triage Notes (Signed)
 C/o dental pain and swelling since yesterday. Denies injury to area.

## 2023-11-20 NOTE — Discharge Instructions (Signed)
 Please take the penicillin  as prescribed.  Take the Percocet as needed for pain.  Do not drive or drink alcohol taking the Percocet as it may make you drowsy.  Please call and schedule follow-up appointment with your dentist and return to the ER for worsening symptoms.

## 2023-11-20 NOTE — ED Provider Notes (Signed)
 [facility] AT [facility] Provider Note   CSN: [id number] Arrival date & time: 11/20/23  9148     Patient presents with: Dental Pain   Molly Sawyer is a 34 y.o. female.   33 year old female with past medical history of known dental caries who has seen oral surgery in the past regarding this presenting to the emergency department today with left-sided dental pain.  The patient states that she is having pain around her lower back molars now over the past 24 to 48 hours.  The patient denies any difficulty breathing or swallowing.  She states that she is having a throbbing pain with this.  Her pain is around tooth 16.  She has been told that she needs this tooth pulled as well as 1 on the right but did not have the money for this she has not followed up for this.  She came to the ER today for further evaluation regarding this.  She has been taking Tylenol  for pain.  Does have anaphylaxis with NSAIDs.   Dental Pain      Prior to Admission medications   Medication Sig Start Date End Date Taking? Authorizing Provider  oxyCODONE -acetaminophen  (PERCOCET/ROXICET) 5-325 MG tablet Take 1 tablet by mouth every 6 (six) hours as needed for severe pain (pain score 7-10). 11/20/23  Yes Ula Prentice SAUNDERS, MD  penicillin  v potassium (VEETID) 500 MG tablet Take 1 tablet (500 mg total) by mouth 4 (four) times daily for 7 days. 11/20/23 11/27/23 Yes Ula Prentice SAUNDERS, MD  acetaminophen  (TYLENOL ) 650 MG CR tablet Take 1,300-1,950 mg by mouth every 8 (eight) hours as needed for pain.    [provider]  benzocaine  (ORAJEL) 10 % mucosal gel Use as directed 1 Application in the mouth or throat 4 (four) times daily as needed for mouth pain. 03/27/22   Aberman, Caroline C, PA-C  cyclobenzaprine  (FLEXERIL ) 10 MG tablet Take 1 tablet (10 mg total) by mouth 2 (two) times daily as needed for muscle spasms. 12/04/20   Tegeler, Lonni PARAS, MD  diphenhydrAMINE  (BENADRYL ) 25 MG tablet Take 1  tablet (25 mg total) by mouth every 6 (six) hours. Patient not taking: No sig reported 02/18/18   Donah Riis A, PA-C  doxycycline  (VIBRAMYCIN ) 100 MG capsule Take 1 capsule (100 mg total) by mouth 2 (two) times daily. 12/17/21   Emelia Sluder, PA-C  famotidine  (PEPCID ) 20 MG tablet Take 1 tablet (20 mg total) by mouth 2 (two) times daily. Patient not taking: No sig reported 02/18/18   Donah Riis A, PA-C  lidocaine  (LIDODERM ) 5 % Place 1 patch onto the skin daily. Remove & Discard patch within 12 hours or as directed by MD Patient not taking: No sig reported 10/28/16   Joy, Shawn C, PA-C  lidocaine  (LIDODERM ) 5 % Place 1 patch onto the skin daily. Remove & Discard patch within 12 hours or as directed by MD 12/04/20   Tegeler, Lonni PARAS, MD  permethrin  (ELIMITE ) 5 % cream Apply to entire body avoiding eyes and mouth at night; wash off 8 hours later when you wake up Patient not taking: No sig reported 02/16/19   Little, Vernell Search, MD    Allergies: Nsaids and Ibuprofen    Review of Systems  HENT:  Positive for dental problem.   All other systems reviewed and are negative.   Updated Vital Signs BP (!) 136/92 (BP Location: Right Arm)   Pulse 83   Temp 98.9 F (37.2 C) (Oral)  Resp 18   SpO2 100%   Physical Exam Vitals and nursing note reviewed.   GEN: NAD HEENT: The patient does have poor dentition with gingival erythema noted around 2 #16 with large dental carry noted.  There is no palpable abscess.  No significant swelling of the posterior oropharynx noted.  No fullness or swelling in the anterior neck is noted, no trismus  (all labs ordered are listed, but only abnormal results are displayed) Labs Reviewed - No data to display  EKG: None  Radiology: No results found.   Procedures   Medications Ordered in the ED  penicillin  v potassium (VEETID) tablet 500 mg (has no administration in time range)                                    Medical Decision  Making 33 year old female presenting to the emergency department today with symptoms consistent with dental infection.  She does not have any findings on exam to suggest deep space soft tissue infection.  I will cover the patient with penicillin .  Will give her a short course of pain medication to take if she does not tolerate NSAIDs.  She is encouraged to follow-up with her dentist.  Is discharged with return precautions.  Risk Prescription drug management.        Final diagnoses:  Dental infection    ED Discharge Orders          Ordered    oxyCODONE -acetaminophen  (PERCOCET/ROXICET) 5-325 MG tablet  Every 6 hours PRN        11/20/23 0905    penicillin  v potassium (VEETID) 500 MG tablet  4 times daily        11/20/23 0905               Ula Prentice SAUNDERS, MD 11/20/23 [phone]

## 2023-11-20 NOTE — ED Notes (Signed)
 Pt alert and oriented X 4 at the time of discharge. RR even and unlabored. No acute distress noted. Pt verbalized understanding of discharge instructions as discussed. Pt ambulatory to lobby at time of discharge.
